# Patient Record
Sex: Female | Born: 1980 | Race: White | Hispanic: Yes | Marital: Married | Smoking: Former smoker
Health system: Southern US, Community
[De-identification: ages and names within clinical notes are randomized; demographics above are authoritative.]

## PROBLEM LIST (undated history)

## (undated) DIAGNOSIS — F32A Depression, unspecified: Secondary | ICD-10-CM

## (undated) DIAGNOSIS — R519 Headache, unspecified: Secondary | ICD-10-CM

## (undated) DIAGNOSIS — G4733 Obstructive sleep apnea (adult) (pediatric): Secondary | ICD-10-CM

## (undated) DIAGNOSIS — F3170 Bipolar disorder, currently in remission, most recent episode unspecified: Secondary | ICD-10-CM

## (undated) DIAGNOSIS — E538 Deficiency of other specified B group vitamins: Secondary | ICD-10-CM

## (undated) DIAGNOSIS — K603 Anal fistula, unspecified: Secondary | ICD-10-CM

## (undated) DIAGNOSIS — I10 Essential (primary) hypertension: Secondary | ICD-10-CM

## (undated) DIAGNOSIS — J309 Allergic rhinitis, unspecified: Secondary | ICD-10-CM

## (undated) DIAGNOSIS — F419 Anxiety disorder, unspecified: Secondary | ICD-10-CM

## (undated) DIAGNOSIS — E669 Obesity, unspecified: Secondary | ICD-10-CM

## (undated) DIAGNOSIS — R51 Headache: Secondary | ICD-10-CM

## (undated) DIAGNOSIS — L309 Dermatitis, unspecified: Secondary | ICD-10-CM

## (undated) DIAGNOSIS — F329 Major depressive disorder, single episode, unspecified: Secondary | ICD-10-CM

## (undated) DIAGNOSIS — Z8639 Personal history of other endocrine, nutritional and metabolic disease: Secondary | ICD-10-CM

## (undated) DIAGNOSIS — R112 Nausea with vomiting, unspecified: Secondary | ICD-10-CM

## (undated) DIAGNOSIS — D509 Iron deficiency anemia, unspecified: Secondary | ICD-10-CM

## (undated) DIAGNOSIS — K649 Unspecified hemorrhoids: Secondary | ICD-10-CM

## (undated) DIAGNOSIS — Z9889 Other specified postprocedural states: Secondary | ICD-10-CM

## (undated) HISTORY — PX: WISDOM TOOTH EXTRACTION: SHX21

## (undated) HISTORY — DX: Anxiety disorder, unspecified: F41.9

## (undated) HISTORY — PX: LAPAROSCOPIC CHOLECYSTECTOMY: SUR755

## (undated) HISTORY — DX: Major depressive disorder, single episode, unspecified: F32.9

## (undated) HISTORY — PX: WRIST GANGLION EXCISION: SUR520

## (undated) HISTORY — DX: Unspecified hemorrhoids: K64.9

## (undated) HISTORY — PX: FOOT SURGERY: SHX648

## (undated) HISTORY — DX: Depression, unspecified: F32.A

## (undated) HISTORY — DX: Essential (primary) hypertension: I10

## (undated) HISTORY — PX: GASTRIC BYPASS: SHX52

## (undated) HISTORY — PX: LEEP: SHX91

## (undated) HISTORY — DX: Headache, unspecified: R51.9

## (undated) HISTORY — DX: Headache: R51

## (undated) HISTORY — PX: LAPAROSCOPIC GASTRIC BANDING: SHX1100

## (undated) HISTORY — DX: Obesity, unspecified: E66.9

## (undated) HISTORY — PX: TONSILLECTOMY AND ADENOIDECTOMY: SUR1326

---

## 2016-09-02 NOTE — Progress Notes (Signed)
Pre visit review using our clinic review tool, if applicable. No additional management support is needed unless otherwise documented below in the visit note. 

## 2016-09-03 ENCOUNTER — Ambulatory Visit (INDEPENDENT_AMBULATORY_CARE_PROVIDER_SITE_OTHER): Payer: BLUE CROSS/BLUE SHIELD | Admitting: Family Medicine

## 2016-09-03 ENCOUNTER — Encounter: Payer: Self-pay | Admitting: Family Medicine

## 2016-09-03 VITALS — BP 104/70 | HR 77 | Temp 98.4°F | Resp 12 | Ht 64.5 in | Wt 232.2 lb

## 2016-09-03 DIAGNOSIS — F3175 Bipolar disorder, in partial remission, most recent episode depressed: Secondary | ICD-10-CM

## 2016-09-03 DIAGNOSIS — F418 Other specified anxiety disorders: Secondary | ICD-10-CM | POA: Diagnosis not present

## 2016-09-03 DIAGNOSIS — F3170 Bipolar disorder, currently in remission, most recent episode unspecified: Secondary | ICD-10-CM | POA: Insufficient documentation

## 2016-09-03 DIAGNOSIS — Z9884 Bariatric surgery status: Secondary | ICD-10-CM | POA: Diagnosis not present

## 2016-09-03 DIAGNOSIS — D509 Iron deficiency anemia, unspecified: Secondary | ICD-10-CM

## 2016-09-03 DIAGNOSIS — G4700 Insomnia, unspecified: Secondary | ICD-10-CM

## 2016-09-03 DIAGNOSIS — Z6839 Body mass index (BMI) 39.0-39.9, adult: Secondary | ICD-10-CM

## 2016-09-03 DIAGNOSIS — E039 Hypothyroidism, unspecified: Secondary | ICD-10-CM | POA: Diagnosis not present

## 2016-09-03 DIAGNOSIS — F329 Major depressive disorder, single episode, unspecified: Secondary | ICD-10-CM

## 2016-09-03 DIAGNOSIS — E538 Deficiency of other specified B group vitamins: Secondary | ICD-10-CM

## 2016-09-03 DIAGNOSIS — F419 Anxiety disorder, unspecified: Secondary | ICD-10-CM

## 2016-09-03 DIAGNOSIS — F32A Depression, unspecified: Secondary | ICD-10-CM | POA: Insufficient documentation

## 2016-09-03 LAB — CBC
HEMATOCRIT: 34.3 % — AB (ref 36.0–46.0)
HEMOGLOBIN: 11.3 g/dL — AB (ref 12.0–15.0)
MCHC: 33 g/dL (ref 30.0–36.0)
MCV: 84.7 fl (ref 78.0–100.0)
Platelets: 290 10*3/uL (ref 150.0–400.0)
RBC: 4.05 Mil/uL (ref 3.87–5.11)
RDW: 17.9 % — ABNORMAL HIGH (ref 11.5–15.5)
WBC: 6.6 10*3/uL (ref 4.0–10.5)

## 2016-09-03 LAB — VITAMIN B12: VITAMIN B 12: 116 pg/mL — AB (ref 211–911)

## 2016-09-03 LAB — FERRITIN: FERRITIN: 5.3 ng/mL — AB (ref 10.0–291.0)

## 2016-09-03 LAB — TSH: TSH: 1.63 u[IU]/mL (ref 0.35–4.50)

## 2016-09-03 LAB — VITAMIN D 25 HYDROXY (VIT D DEFICIENCY, FRACTURES): VITD: 30.24 ng/mL (ref 30.00–100.00)

## 2016-09-03 MED ORDER — ESCITALOPRAM OXALATE 20 MG PO TABS: 20.0000 mg | ORAL_TABLET | Freq: Every day | ORAL | 0 refills | Status: DC

## 2016-09-03 MED ORDER — QUETIAPINE FUMARATE 50 MG PO TABS: 150.0000 mg | ORAL_TABLET | Freq: Every day | ORAL | 1 refills | Status: DC

## 2016-09-03 NOTE — Progress Notes (Signed)
HPI:   [name] is a 35 y.o. female, who is here today to establish care with me.  Former PCP: N/A Last preventive routine visit: 03/2016 gyn preventive care.  Concerns today: Medications refill.  History of anxiety and depression.  Hx of psychiatric hospitalizations or ER visits: Denies Any hx of bipolar disorder: Years, last exacerbation about 3 years ago. Suicidal thoughts: Denies Insomnia: Yes, sleeping about 6 hours, wakes up a few times per night. Currently she is on inpatient CR 12.5 mg daily. She ran out of the Seroquel about 7-10 days ago.  She has not taking Lexapro for the past 6-7 months, since then her anxiety has been worse, states that she is "in a bad place" now, emotionally; which she attributes to not taking her meds and recent relocation in this area.  Tobacco use: Yes Alcohol abuse: Denies Hx of illicit drug use: Denies FHx for psychiatric disorders: Yes  Medications in the past: She has tried several medications, combination of Lexapro and Seroquel seemed to help the most.   She lives with husband and 3 children.   -She doesn't exercise regularly, she has not been consistent with a healthy diet. She reports long Hx of pain "all over"; knees and hips pain mainly, no edema or erythema, exacerbated by certain activities as going up or down stairs.  She denies any history of fibromyalgia.  She hasn't noted any rash or fever, no oral lesions, or hair loss.  + Fatigue and decreased appetite.  In the past she followed Weight Watchers, she doesn't feel like she is ready to follow a diet right now.      + Smoker.  -S/P bariatric surgery about 6-7 years ago, she is not taking multivitamins.  History of B12 deficiency, currently she is not on B12 supplementation, she used to receive B12 injections monthly.  History of iron deficiency anemia, she is not taking ferrous sulfate as instructed.    Denies abdominal pain, nausea, vomiting, changes in bowel  habits, blood in stool or melena.  She also reports history of hypothyroidism, in the past she was on thyroid medication but was discontinued because resolved.   Review of Systems  Constitutional: Positive for appetite change and fatigue. Negative for fever and unexpected weight change.  HENT: Negative for mouth sores, nosebleeds and trouble swallowing.   Eyes: Negative for pain and visual disturbance.  Respiratory: Negative for cough, shortness of breath and wheezing.   Cardiovascular: Negative for chest pain, palpitations and leg swelling.  Gastrointestinal: Negative for abdominal pain, nausea and vomiting.       Negative for changes in bowel habits.  Endocrine: Negative for cold intolerance, heat intolerance, polydipsia, polyphagia and polyuria.  Genitourinary: Negative for decreased urine volume, difficulty urinating and hematuria.  Musculoskeletal: Positive for arthralgias and myalgias.  Skin: Negative for rash and wound.  Neurological: Negative for seizures, syncope, weakness, numbness and headaches.  Psychiatric/Behavioral: Positive for sleep disturbance. Negative for agitation, confusion, dysphoric mood, hallucinations and suicidal ideas. The patient is nervous/anxious.       No current outpatient prescriptions on file prior to visit.   No current facility-administered medications on file prior to visit.      Past Medical History:  Diagnosis Date  . Anxiety   . Chicken pox   . Depression   . Frequent headaches   . Thyroid disease    Not on File  Family History  Problem Relation Age of Onset  . Mental illness Mother   .  Alcohol abuse Father   . Mental illness Father   . Mental illness Maternal Aunt   . Mental illness Maternal Uncle   . Alcohol abuse Maternal Uncle   . Mental illness Maternal Grandmother     Social History   Social History  . Marital status: Married    Spouse name: N/A  . Number of children: N/A  . Years of education: N/A   Social  History Main Topics  . Smoking status: Current Every Day Smoker  . Smokeless tobacco: Never Used  . Alcohol use Yes  . Drug use: No  . Sexual activity: Yes    Birth control/ protection: IUD   Other Topics Concern  . None   Social History Narrative  . None    Vitals:   09/03/16 1041  BP: 104/70  Pulse: 77  Resp: 12  Temp: 98.4 F (36.9 C)   O2 sat 96% at RA.  Body mass index is 39.25 kg/m.     Physical Exam  Nursing note and vitals reviewed. Constitutional: She is oriented to person, place, and time. She appears well-developed. No distress.  HENT:  Head: Atraumatic.  Mouth/Throat: Oropharynx is clear and moist and mucous membranes are normal.  Eyes: Conjunctivae and EOM are normal. Pupils are equal, round, and reactive to light.  Neck: No thyroid mass and no thyromegaly present.  Cardiovascular: Normal rate and regular rhythm.   No murmur heard. Pulses:      Dorsalis pedis pulses are 2+ on the right side, and 2+ on the left side.  Respiratory: Effort normal and breath sounds normal. No respiratory distress.  GI: Soft. She exhibits no mass. There is no hepatomegaly. There is no tenderness.  Musculoskeletal: She exhibits no edema or tenderness.  No significant joint deformities or limitation of ROM appreciated. No signs of synovitis.  Lymphadenopathy:    She has no cervical adenopathy.  Neurological: She is alert and oriented to person, place, and time. She has normal strength. Coordination and gait normal.  Skin: Skin is warm. No rash noted. No erythema.  Psychiatric: Her speech is normal. Her mood appears anxious. Her affect is labile. She is not agitated and not aggressive. She expresses no suicidal ideation. She expresses no suicidal plans.  adequately groomed, good eye contact.      ASSESSMENT AND PLAN:     Monica Jefferson was seen today for new patient (initial visit).  Diagnoses and all orders for this visit:  Insomnia, unspecified  Good sleep  hygiene. Changes on Ambien CR , I explained if I am continuing this medication I do not give 3 months supply at the same time. Some side effects discussed.  She will resume Seroquel 150 mg at bedtime.  Follow-up in 3-4 weeks.   -     QUEtiapine (SEROQUEL) 50 MG tablet; Take 3 tablets (150 mg total) by mouth at bedtime.  Hypothyroidism, unspecified hypothyroidism type  For the recommendations would be given according to lab results.  -     Ferritin -     TSH  Body mass index (BMI) of 39.0 to 39.9 in adult  We discussed benefits of wt loss as well as adverse effects of obesity. Consistency with healthy diet and physical activity recommended. Weight Watchers is a good option as well as daily brisk walking for 15-30 min as tolerated. For now she is not interested in following a specific diet.  Anxiety and depression  She denies any suicidal thought or plan. Resume Lexapro 20 mg daily. Clearly  instructed about warning signs. Follow-up in 3-4 weeks, before if needed.  -     escitalopram (LEXAPRO) 20 MG tablet; Take 1 tablet (20 mg total) by mouth daily.  Bipolar disorder, in partial remission, most recent episode depressed (HCC)  Resume Seroquel 150 mg daily. She prefers to hold on psychiatrist referral, she would like to do some research and she will let me know she would like to see.    Hx of bariatric surgery  We discussed some of the complications after bariatric procedures, recommend daily multivitamin. Further recommendations would be given according to lab results.  -     Vitamin B12 -     CBC -     Ferritin -     VITAMIN D 25 Hydroxy (Vit-D Deficiency, Fractures)  B12 deficiency  No changes in current management, will follow labs done today and will give further recommendations accordingly.  -     Vitamin B12  Anemia, iron deficiency  For the recommendations would be given according to lab results.  -     CBC -     Ferritin         Saima Monterroso G. [place],  MD  [facility]. [place] office.

## 2016-09-03 NOTE — Patient Instructions (Signed)
A few things to remember from today's visit:   Hypothyroidism, unspecified hypothyroidism type - Plan: Ferritin, TSH  Anxiety and depression - Plan: escitalopram (LEXAPRO) 20 MG tablet  Insomnia, unspecified - Plan: QUEtiapine (SEROQUEL) 50 MG tablet  Bipolar disorder, in partial remission, most recent episode depressed (HCC)  Hx of bariatric surgery - Plan: Vitamin B12, CBC, Ferritin, VITAMIN D 25 Hydroxy (Vit-D Deficiency, Fractures)  B12 deficiency - Plan: Vitamin B12  Anemia, iron deficiency - Plan: CBC, Ferritin  Medications resumed. Ambien I do not do 3 month supply at the time.  Please let me know which psychiatrist he wanted to see.  Try 15 min brisk walking daily.   We have ordered labs or studies at this visit.  It can take up to 1-2 weeks for results and processing. IF results require follow up or explanation, we will call you with instructions. Clinically stable results will be released to your [facility]. If you have not heard from us or cannot find your results in [facility] in 2 weeks please contact our office at [phone].  If you are not yet signed up for [facility], please consider signing up  Please be sure medication list is accurate. If a new problem present, please set up appointment sooner than planned today.

## 2016-09-07 ENCOUNTER — Encounter: Payer: Self-pay | Admitting: Family Medicine

## 2016-09-08 ENCOUNTER — Telehealth: Payer: Self-pay | Admitting: Family Medicine

## 2016-09-08 MED ORDER — CYANOCOBALAMIN 1000 MCG/ML IJ SOLN: 1000.0000 ug | Freq: Once | INTRAMUSCULAR | 11 refills | Status: AC

## 2016-09-08 MED ORDER — "SYRINGE/NEEDLE (DISP) 25G X 1"" 3 ML MISC": 1 refills | Status: DC

## 2016-09-08 MED ORDER — FERROUS SULFATE 325 (65 FE) MG PO TABS: 325.0000 mg | ORAL_TABLET | Freq: Every day | ORAL | 1 refills | Status: DC

## 2016-09-08 NOTE — Telephone Encounter (Signed)
[facility] °

## 2016-09-08 NOTE — Telephone Encounter (Signed)
Pt would like to do her own b12 injections. Would like b12 rx  and needles sent to   Also needs  Fe Sulfate 325 mg    [facility]/ [place]

## 2016-10-02 ENCOUNTER — Encounter: Payer: Self-pay | Admitting: Family Medicine

## 2016-10-02 NOTE — Telephone Encounter (Signed)
Okay to refill? 

## 2016-10-25 ENCOUNTER — Other Ambulatory Visit: Payer: Self-pay | Admitting: Family Medicine

## 2016-10-25 DIAGNOSIS — G4700 Insomnia, unspecified: Secondary | ICD-10-CM

## 2016-11-17 ENCOUNTER — Telehealth: Payer: Self-pay | Admitting: Family Medicine

## 2016-11-17 NOTE — Telephone Encounter (Signed)
Pt need new Rx for zolpidem    Pharm: [facility] [place]  Pt was wanting to know if you had a preference of sending it only to a local pharmacy or would you do a mail order pharmacy.

## 2016-11-17 NOTE — Telephone Encounter (Signed)
See below

## 2016-11-19 NOTE — Telephone Encounter (Signed)
Pt following up on request refill  zolpidem (AMBIEN CR) 12.5 MG CR tablet

## 2016-11-20 ENCOUNTER — Ambulatory Visit (INDEPENDENT_AMBULATORY_CARE_PROVIDER_SITE_OTHER): Payer: BLUE CROSS/BLUE SHIELD | Admitting: Family Medicine

## 2016-11-20 ENCOUNTER — Encounter: Payer: Self-pay | Admitting: Family Medicine

## 2016-11-20 VITALS — BP 126/90 | HR 89 | Resp 12 | Ht 64.5 in | Wt 233.2 lb

## 2016-11-20 DIAGNOSIS — F3175 Bipolar disorder, in partial remission, most recent episode depressed: Secondary | ICD-10-CM | POA: Diagnosis not present

## 2016-11-20 DIAGNOSIS — G4700 Insomnia, unspecified: Secondary | ICD-10-CM | POA: Diagnosis not present

## 2016-11-20 MED ORDER — QUETIAPINE FUMARATE ER 300 MG PO TB24: 300.0000 mg | ORAL_TABLET | Freq: Every day | ORAL | 1 refills | Status: DC

## 2016-11-20 MED ORDER — CYANOCOBALAMIN 1000 MCG/ML IJ SOLN: 1000.0000 ug | Freq: Once | INTRAMUSCULAR | 3 refills | Status: AC

## 2016-11-20 MED ORDER — ZOLPIDEM TARTRATE ER 12.5 MG PO TBCR: 12.5000 mg | EXTENDED_RELEASE_TABLET | Freq: Every day | ORAL | 3 refills | Status: DC

## 2016-11-20 NOTE — Patient Instructions (Signed)
A few things to remember from today's visit:   Insomnia, unspecified type - Plan: zolpidem (AMBIEN CR) 12.5 MG CR tablet  Bipolar disorder, in partial remission, most recent episode depressed (HCC) - Plan: QUEtiapine (SEROQUEL XR) 300 MG 24 hr tablet  Let me know how you do with Seroquel changes.   Please be sure medication list is accurate. If a new problem present, please set up appointment sooner than planned today.

## 2016-11-20 NOTE — Telephone Encounter (Signed)
I believe I saw her last 08/2016 and she was supposed to follow in 4-6 weeks after Lexapro was added. Thanks, BJ

## 2016-11-20 NOTE — Progress Notes (Signed)
Pre visit review using our clinic review tool, if applicable. No additional management support is needed unless otherwise documented below in the visit note. 

## 2016-11-20 NOTE — Progress Notes (Signed)
HPI:   [name] is a 35 y.o. female, who is here today (with her 2 children) to follow on on depression/bipolar disorder and insomnia. She was seen on 09/03/16, when Lexapro and Seroquel were resumed and Ambien CR 12.5 mg continued. She was supposed to follow  3-4 weeks later but she did not do so.  She discontinued Lexapro because decreased libido, which she forgot she also had when she tried before. She is on Seroquel 50 mg 3 tabs at bedtime, which helps her sleep along with Ambien.  She denies suicidal thoughts. Mild depression, which she feels like she could manage without adding other medications. She denies  anxiety.   Since her last OV, she has been exercising some, walking almost daily, no major changes in her diet.   Concerns today: Needs B12 sl send to mail order.Hx of B12 deficiency, s/p bariatric surgery.     Review of Systems  Constitutional: Positive for fatigue. Negative for appetite change, fever and unexpected weight change.  HENT: Negative for mouth sores, nosebleeds and trouble swallowing.   Respiratory: Negative for shortness of breath and wheezing.   Cardiovascular: Negative for chest pain and palpitations.  Gastrointestinal: Negative for abdominal pain, nausea and vomiting.       Negative for changes in bowel habits.  Neurological: Negative for syncope, weakness and headaches.  Psychiatric/Behavioral: Positive for sleep disturbance. Negative for confusion, hallucinations and suicidal ideas. The patient is not nervous/anxious.       Current Outpatient Prescriptions on File Prior to Visit  Medication Sig Dispense Refill  . Cyanocobalamin (B-12 PO) Take 1 tablet by mouth daily.    . ferrous sulfate 325 (65 FE) MG tablet Take 1 tablet (325 mg total) by mouth daily with breakfast. 90 tablet 1  . SYRINGE-NEEDLE, DISP, 3 ML 25G X 1" 3 ML MISC Use with B12 injections. 100 each 1   No current facility-administered medications on file prior to  visit.      Past Medical History:  Diagnosis Date  . Anxiety   . Chicken pox   . Depression   . Frequent headaches   . Thyroid disease    Not on File  Social History   Social History  . Marital status: Married    Spouse name: N/A  . Number of children: N/A  . Years of education: N/A   Social History Main Topics  . Smoking status: Current Every Day Smoker  . Smokeless tobacco: Never Used  . Alcohol use Yes  . Drug use: No  . Sexual activity: Yes    Birth control/ protection: IUD   Other Topics Concern  . None   Social History Narrative  . None    Vitals:   11/20/16 1529  BP: 126/90  Pulse: 89  Resp: 12   Body mass index is 39.42 kg/m.  Wt Readings from Last 3 Encounters:  11/20/16 233 lb 4 oz (105.8 kg)  09/03/16 232 lb 4 oz (105.3 kg)       Physical Exam  Nursing note and vitals reviewed. Constitutional: She is oriented to person, place, and time. She appears well-developed. No distress.  HENT:  Mouth/Throat: Oropharynx is clear and moist and mucous membranes are normal.  Eyes: Conjunctivae and EOM are normal.  Cardiovascular: Normal rate and regular rhythm.   No murmur heard. Respiratory: Effort normal and breath sounds normal. No respiratory distress.  GI: Soft.  Musculoskeletal: She exhibits no edema.  Neurological: She is alert and oriented  to person, place, and time. She has normal strength. Coordination and gait normal.  Skin: Skin is warm. No erythema.  Psychiatric: She has a normal mood and affect. Her speech is normal. Cognition and memory are normal.  Well groomed, good eye contact.      ASSESSMENT AND PLAN:     Monica Jefferson was seen today for medication management.  Diagnoses and all orders for this visit:  Insomnia, unspecified type  Well controlled with current management. Good sleep hygiene. No changes in Ambien. F/U in 3-4 months.   -     zolpidem (AMBIEN CR) 12.5 MG CR tablet; Take 1 tablet (12.5 mg total) by mouth at  bedtime.  Bipolar disorder, in partial remission, most recent episode depressed (HCC)  She agrees with increasing dose of Seroquel and changing from immediate release to XR. Instructed about warning signs. She was instructed to let me know through My Chart if she has any problem. Healthy diet and daily Probiotic may help.  F/U in 3-4 months.   -     QUEtiapine (SEROQUEL XR) 300 MG 24 hr tablet; Take 1 tablet (300 mg total) by mouth at bedtime.  Other orders -     cyanocobalamin (,VITAMIN B-12,) 1000 MCG/ML injection; Inject 1 mL (1,000 mcg total) into the muscle once.           -Ms. Monica Jefferson was advised to return sooner than planned today if new concerns arise.       Delphia Kaylor G. [place], MD  [facility]. [place] office.

## 2016-11-20 NOTE — Telephone Encounter (Signed)
Patient has an appointment for this afternoon.

## 2016-12-07 ENCOUNTER — Telehealth: Payer: Self-pay | Admitting: Family Medicine

## 2016-12-07 DIAGNOSIS — G4700 Insomnia, unspecified: Secondary | ICD-10-CM

## 2016-12-09 NOTE — Telephone Encounter (Addendum)
Pt would like to switch back to the  QUEtiapine (SEROQUEL) 50 MG tablet 3 tabs  Pt states the  QUEtiapine (SEROQUEL XR) 300 MG 24 hr tablet  is not working  [place],  [facility].  Pt leaving [place] tonight and would like to have asap. Will try and get it transferred to where she is going if we can just get [facility].

## 2016-12-10 MED ORDER — QUETIAPINE FUMARATE 50 MG PO TABS: ORAL_TABLET | ORAL | 1 refills | Status: DC

## 2016-12-10 NOTE — Addendum Note (Signed)
Addended by: Marcell AngerSELF, Akshar Starnes E on: 12/10/2016 09:49 AM   Modules accepted: Orders

## 2016-12-10 NOTE — Telephone Encounter (Signed)
Rx sent to pharmacy   

## 2016-12-10 NOTE — Telephone Encounter (Signed)
It is Ok to sent or call in Rx for Seroquel 50 mg to continue 3 tabs at bedtime. She can try also adding 2 tabs in the morning for a total of 250 mg daily and see if she notices any improvement.   She could change to 100 mg tabs, so she can take 1 tab am and 1.5 tab at bedtime instead taking 3 tabs [if she wants to continue 50 mg tab it is fine].  Thanks, BJ

## 2016-12-18 ENCOUNTER — Ambulatory Visit: Payer: BLUE CROSS/BLUE SHIELD | Admitting: Family Medicine

## 2016-12-19 ENCOUNTER — Ambulatory Visit: Payer: BLUE CROSS/BLUE SHIELD | Admitting: Family Medicine

## 2017-02-04 ENCOUNTER — Other Ambulatory Visit: Payer: Self-pay | Admitting: Family Medicine

## 2017-03-10 ENCOUNTER — Other Ambulatory Visit: Payer: Self-pay | Admitting: Family Medicine

## 2017-03-10 DIAGNOSIS — G4700 Insomnia, unspecified: Secondary | ICD-10-CM

## 2017-03-12 ENCOUNTER — Telehealth: Payer: Self-pay

## 2017-03-12 NOTE — Telephone Encounter (Signed)
Message sent to patient via mychart

## 2017-03-12 NOTE — Telephone Encounter (Signed)
-----   Message from Betty G [place], MD sent at 03/11/2017 10:38 PM EDT ----- Regarding: Refill I sent refill for Effexor 75 mg to her pharmacy # [id number]. Please advise to establish with psychiatrists to continue following on bipolar disorder and insomnia.  Thanks, BJ

## 2017-03-12 NOTE — Telephone Encounter (Signed)
Pt following up on request refill  zolpidem (AMBIEN CR) 12.5 MG CR tablet  Pt got a message somthing else was refilled and she does not need that medicine.  Pt is out of town and [facility]   Address: [address], [place], [place] [id number]        Phone: [phone]

## 2017-03-12 NOTE — Telephone Encounter (Signed)
Noted that Rx's for Ambien have been filled earlier: 11/20/16,12/17/16, 01/14/17, and 02/11/17. Technically she is due April 5-6th/2018. Ambien CR 12.5 mg can be called in to continue 1 tab at bedtime as needed #30/1 to be filled 03/17/2017 and 04/16/17.  Thanks, BJ

## 2017-03-12 NOTE — Telephone Encounter (Signed)
[facility] [address] Had to change back to the pharmacy that originally requested the refill to sign the order.  Patient cannot fill until 03/17/17. Previous Rx's have been filled earlier than they were suppose too, so she should still have some left.

## 2017-03-12 NOTE — Telephone Encounter (Signed)
Refill request sent to Dr. [place].

## 2017-04-09 ENCOUNTER — Telehealth: Payer: Self-pay | Admitting: Family Medicine

## 2017-04-09 ENCOUNTER — Other Ambulatory Visit: Payer: Self-pay | Admitting: Family Medicine

## 2017-04-09 MED ORDER — QUETIAPINE FUMARATE 50 MG PO TABS: 150.0000 mg | ORAL_TABLET | Freq: Every day | ORAL | 0 refills | Status: DC

## 2017-04-09 NOTE — Telephone Encounter (Signed)
Please have her schedule a follow up appointment and then I will send the Rx in.  Thank you!

## 2017-04-09 NOTE — Telephone Encounter (Signed)
° ° ° °  She has a fup scheduled for 7/18//18

## 2017-04-09 NOTE — Telephone Encounter (Signed)
Rx sent 

## 2017-04-09 NOTE — Telephone Encounter (Signed)
Pt has made the needed follow up before the 90 days will be up. Pt states she is out of the meds and needs as soon as you can send in. (please note [facility] pharmacy/  [facility] [address])

## 2017-04-09 NOTE — Telephone Encounter (Signed)
° ° °  Pt request refill of the following:  QUEtiapine (SEROQUEL) 50 MG tablet  Pt insurance plan will only pay for 90 day supply and she is asking that the RX be for 90 day supply    Phamacy: [facility] [address]

## 2017-05-04 ENCOUNTER — Telehealth: Payer: Self-pay | Admitting: Family Medicine

## 2017-05-04 NOTE — Telephone Encounter (Signed)
Patient calling to request to change pcp from Dr. [place]il[place]Jordan to Dr. Beverely Lowabori.

## 2017-05-04 NOTE — Telephone Encounter (Signed)
It is fine with me. ?Thanks ?

## 2017-05-04 NOTE — Telephone Encounter (Signed)
Ok w/ me 

## 2017-05-06 NOTE — Telephone Encounter (Signed)
Patient notified, appt scheduled.

## 2017-05-08 ENCOUNTER — Ambulatory Visit (INDEPENDENT_AMBULATORY_CARE_PROVIDER_SITE_OTHER): Payer: BLUE CROSS/BLUE SHIELD | Admitting: Family Medicine

## 2017-05-08 ENCOUNTER — Encounter: Payer: Self-pay | Admitting: Family Medicine

## 2017-05-08 VITALS — BP 123/81 | HR 75 | Temp 98.1°F | Resp 16 | Ht 65.0 in | Wt 238.0 lb

## 2017-05-08 DIAGNOSIS — E538 Deficiency of other specified B group vitamins: Secondary | ICD-10-CM

## 2017-05-08 DIAGNOSIS — E039 Hypothyroidism, unspecified: Secondary | ICD-10-CM | POA: Diagnosis not present

## 2017-05-08 DIAGNOSIS — G4700 Insomnia, unspecified: Secondary | ICD-10-CM | POA: Diagnosis not present

## 2017-05-08 DIAGNOSIS — J301 Allergic rhinitis due to pollen: Secondary | ICD-10-CM | POA: Diagnosis not present

## 2017-05-08 DIAGNOSIS — J302 Other seasonal allergic rhinitis: Secondary | ICD-10-CM | POA: Insufficient documentation

## 2017-05-08 DIAGNOSIS — Z975 Presence of (intrauterine) contraceptive device: Secondary | ICD-10-CM | POA: Diagnosis not present

## 2017-05-08 DIAGNOSIS — F3175 Bipolar disorder, in partial remission, most recent episode depressed: Secondary | ICD-10-CM | POA: Diagnosis not present

## 2017-05-08 DIAGNOSIS — Z6839 Body mass index (BMI) 39.0-39.9, adult: Secondary | ICD-10-CM

## 2017-05-08 LAB — CBC WITH DIFFERENTIAL/PLATELET
BASOS ABS: 0 {cells}/uL (ref 0–200)
Basophils Relative: 0 %
EOS ABS: 164 {cells}/uL (ref 15–500)
Eosinophils Relative: 2 %
HCT: 39.2 % (ref 35.0–45.0)
Hemoglobin: 12.7 g/dL (ref 11.7–15.5)
Lymphocytes Relative: 26 %
Lymphs Abs: 2132 cells/uL (ref 850–3900)
MCH: 28.3 pg (ref 27.0–33.0)
MCHC: 32.4 g/dL (ref 32.0–36.0)
MCV: 87.3 fL (ref 80.0–100.0)
MONOS PCT: 9 %
MPV: 10.8 fL (ref 7.5–12.5)
Monocytes Absolute: 738 cells/uL (ref 200–950)
Neutro Abs: 5166 cells/uL (ref 1500–7800)
Neutrophils Relative %: 63 %
Platelets: 309 10*3/uL (ref 140–400)
RBC: 4.49 MIL/uL (ref 3.80–5.10)
RDW: 15.4 % — ABNORMAL HIGH (ref 11.0–15.0)
WBC: 8.2 10*3/uL (ref 3.8–10.8)

## 2017-05-08 LAB — TSH: TSH: 2.65 mIU/L

## 2017-05-08 MED ORDER — ZOLPIDEM TARTRATE ER 12.5 MG PO TBCR: 12.5000 mg | EXTENDED_RELEASE_TABLET | Freq: Every day | ORAL | 0 refills | Status: DC

## 2017-05-08 MED ORDER — VORTIOXETINE HBR 10 MG PO TABS: 1.0000 | ORAL_TABLET | Freq: Every day | ORAL | 3 refills | Status: DC

## 2017-05-08 NOTE — Patient Instructions (Signed)
Follow up in 1 month to recheck mood We'll notify you of your lab results and make any changes if needed Start the Trintellix once daily Continue the Seroquel nightly Continue daily Zyrtec or Xyzal for the seasonal allergies Continue to work on healthy diet and regular exercise- you can do it! We'll call you with your GYN and allergy appts Call and schedule an appt with Psychiatry and Counseling Call with any questions or concerns Welcome!  We're glad to have you!!! We'll get this!!!

## 2017-05-08 NOTE — Progress Notes (Signed)
   Subjective:    Patient ID: Monica Jefferson, female    DOB: 12/14/1981, 36 y.o.   MRN: [id number]  HPI New to establish.  Previous MD- [place]  IUD- pt needs GYN referral b/c previous IUD required surgical removal.  Bipolar- chronic problem.  Currently on Seroquel.  Was previously on Lexapro but this decreased libido.  'i'm depressed'.  Asking for referral to psychiatrist and counseling.  B12 deficiency- ongoing issue.  Stopped taking B12 injxns.  + fatigue.  Hypothyroid- pt has hx of this.  Previously on medication.  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  Obesity- chronic problem.  Pt has gained 5 lbs since Dec.  BMI is now 39.61.  Not following a particular diet and not getting regular exercise  Seasonal allergies- chronic problem, was seeing allergist in [place].  Taking daily antihistamine w/o improvement.  Was previously getting allergy shots.  Pt hates nasal sprays.   Review of Systems For ROS see HPI     Objective:   Physical Exam  Constitutional: She is oriented to person, place, and time. She appears well-developed and well-nourished. No distress.  obese  HENT:  Head: Normocephalic and atraumatic.  Eyes: Conjunctivae and EOM are normal. Pupils are equal, round, and reactive to light.  Neck: Normal range of motion. Neck supple. No thyromegaly present.  Cardiovascular: Normal rate, regular rhythm, normal heart sounds and intact distal pulses.   No murmur heard. Pulmonary/Chest: Effort normal and breath sounds normal. No respiratory distress.  Abdominal: Soft. She exhibits no distension. There is no tenderness.  Musculoskeletal: She exhibits no edema.  Lymphadenopathy:    She has no cervical adenopathy.  Neurological: She is alert and oriented to person, place, and time.  Skin: Skin is warm and dry.  Psychiatric: Her behavior is normal.  Tearful, anxious  Vitals reviewed.         Assessment & Plan:

## 2017-05-08 NOTE — Progress Notes (Signed)
Pre visit review using our clinic review tool, if applicable. No additional management support is needed unless otherwise documented below in the visit note. 

## 2017-05-09 LAB — BASIC METABOLIC PANEL
BUN: 8 mg/dL (ref 7–25)
CO2: 23 mmol/L (ref 20–31)
Calcium: 9.8 mg/dL (ref 8.6–10.2)
Chloride: 101 mmol/L (ref 98–110)
Creat: 0.76 mg/dL (ref 0.50–1.10)
GLUCOSE: 86 mg/dL (ref 65–99)
POTASSIUM: 4.8 mmol/L (ref 3.5–5.3)
Sodium: 137 mmol/L (ref 135–146)

## 2017-05-09 LAB — LIPID PANEL
Cholesterol: 219 mg/dL — ABNORMAL HIGH (ref <200)
HDL: 55 mg/dL (ref >50)
LDL Cholesterol: 143 mg/dL — ABNORMAL HIGH (ref <100)
Total CHOL/HDL Ratio: 4 Ratio (ref <5.0)
Triglycerides: 107 mg/dL (ref <150)
VLDL: 21 mg/dL (ref <30)

## 2017-05-09 LAB — HEPATIC FUNCTION PANEL
ALK PHOS: 117 U/L — AB (ref 33–115)
ALT: 33 U/L — AB (ref 6–29)
AST: 37 U/L — ABNORMAL HIGH (ref 10–30)
Albumin: 4.5 g/dL (ref 3.6–5.1)
Bilirubin, Direct: 0.1 mg/dL (ref <=0.2)
Indirect Bilirubin: 0.2 mg/dL (ref 0.2–1.2)
TOTAL PROTEIN: 7.6 g/dL (ref 6.1–8.1)
Total Bilirubin: 0.3 mg/dL (ref 0.2–1.2)

## 2017-05-09 LAB — VITAMIN B12: Vitamin B-12: 300 pg/mL (ref 200–1100)

## 2017-05-12 NOTE — Assessment & Plan Note (Signed)
Pt has hx of this but not currently on medication.  Check labs.  Start meds prn.

## 2017-05-12 NOTE — Assessment & Plan Note (Signed)
Ongoing issue for pt.  She admits to being very depressed.  On Seroquel to stabilize mood but this is not helping w/ depression.  Previously on Lexapro but this decreased libido.  Will start Trintellix while awaiting appt w/ Psych.  Pt given list of names and numbers to schedule appt.  Will follow closely.

## 2017-05-12 NOTE — Assessment & Plan Note (Signed)
Ongoing issue for pt.  She has had 3 bariatric surgeries w/o success.  She is determined to lose weight w/ diet and exercise.  Check labs to risk stratify.  Will follow.

## 2017-05-12 NOTE — Assessment & Plan Note (Signed)
Pt needs GYN referral as strings were not visible at last pap.  Referral placed.

## 2017-05-12 NOTE — Assessment & Plan Note (Signed)
New to provider, ongoing for pt.  Was seeing allergist in [place] for shots.  Taking daily antihistamine w/o relief.  Pt doesn't tolerate nasal steroid sprays.  Discussed possible addition of Singulair but do not want to start more than 1 medication at this time and her emotional healthy is most important.  Will refer to Allergy for ongoing management.  Pt expressed understanding and is in agreement w/ plan.

## 2017-05-12 NOTE — Assessment & Plan Note (Signed)
Ongoing issue for pt.  Refill provided on Ambien as this helps considerably

## 2017-05-12 NOTE — Assessment & Plan Note (Signed)
Pt has hx of this.  Was previously getting B12 injxns but stopped.  Check levels and replete prn.

## 2017-05-13 ENCOUNTER — Telehealth: Payer: Self-pay | Admitting: *Deleted

## 2017-05-13 NOTE — Telephone Encounter (Signed)
Patient called regarding her lab results.  She does not want to take an OTC B12 because she says that it is easier to just do a shot a week.  She is asking if this is a possibility so that she does not waste money buy supplements since they aren't going to work.

## 2017-05-14 NOTE — Telephone Encounter (Signed)
Patient was informed of doing monthly B12 injections and not weekly.  Stated verbal understanding - She states that she has all the stuff to do her B12 injections monthly at home.  She says that she wants to continue this (she has 6 months worth), then she will call us for reevaluation.

## 2017-05-14 NOTE — Telephone Encounter (Signed)
Since she is at low end of normal but actually deficient at this time, would recommend B12 shots monthly x6 rather than weekly.  She can schedule those here at a nurse visit

## 2017-06-10 ENCOUNTER — Telehealth: Payer: Self-pay | Admitting: Family Medicine

## 2017-06-10 ENCOUNTER — Encounter: Payer: Self-pay | Admitting: Family Medicine

## 2017-06-10 ENCOUNTER — Ambulatory Visit (INDEPENDENT_AMBULATORY_CARE_PROVIDER_SITE_OTHER): Payer: BLUE CROSS/BLUE SHIELD | Admitting: Family Medicine

## 2017-06-10 VITALS — BP 128/89 | HR 89 | Temp 98.2°F | Resp 16 | Ht 65.0 in | Wt 237.0 lb

## 2017-06-10 DIAGNOSIS — F32A Depression, unspecified: Secondary | ICD-10-CM

## 2017-06-10 DIAGNOSIS — F329 Major depressive disorder, single episode, unspecified: Secondary | ICD-10-CM

## 2017-06-10 DIAGNOSIS — F419 Anxiety disorder, unspecified: Secondary | ICD-10-CM | POA: Diagnosis not present

## 2017-06-10 MED ORDER — BUSPIRONE HCL 15 MG PO TABS: 15.0000 mg | ORAL_TABLET | Freq: Two times a day (BID) | ORAL | 3 refills | Status: DC

## 2017-06-10 NOTE — Telephone Encounter (Signed)
Medication filled to pharmacy as requested.   

## 2017-06-10 NOTE — Progress Notes (Signed)
Pre visit review using our clinic review tool, if applicable. No additional management support is needed unless otherwise documented below in the visit note. 

## 2017-06-10 NOTE — Telephone Encounter (Signed)
Patients states she was seen this morning and [facility]. She is [facility] and [facility] [place] [facility].

## 2017-06-10 NOTE — Patient Instructions (Signed)
Follow up in 4-6 weeks, sooner if needed Start the Buspar twice daily.  Take a 1/2 tab twice daily x2 weeks and then increase to 1 tab twice daily Please call and schedule with a psychiatrist- you deserve to feel better!!! Continue to work on regular activity- this will improve your energy level! Call with any questions or concerns Have a great 4th!!!

## 2017-06-10 NOTE — Progress Notes (Signed)
   Subjective:    Patient ID: Monica Jefferson, female    DOB: 01/22/1981, 36 y.o.   MRN: [id number]  HPI Anxiety/depression- pt started Trintellix at last visit due to increased anxiety and depression.  She reports spells that she will have dizziness, HA, some sweats, tingling in hands/feet.  Pt reports she has these sxs both on and off medication.  Did not note any improvement in anxiety after a month of medication.  She then stopped meds.  Pt has not called any local psychs as discussed at last visit.   Review of Systems For ROS see HPI     Objective:   Physical Exam  Constitutional: She is oriented to person, place, and time. She appears well-developed and well-nourished. No distress.  HENT:  Head: Normocephalic and atraumatic.  Neurological: She is alert and oriented to person, place, and time.  Psychiatric: She has a normal mood and affect. Her behavior is normal. Thought content normal.  Vitals reviewed.         Assessment & Plan:

## 2017-06-10 NOTE — Assessment & Plan Note (Signed)
Pt did not notice any improvement on the Trintellix and stopped it after 3 weeks.  Feels her anxiety is the predominate factor right now and she will get depressed over the fact she has anxiety.  Since she has stopped various SSRIs due to side effects, will start Buspar.  Strongly encouraged her to call and set up appt w/ psych.  Will follow closely.

## 2017-07-01 ENCOUNTER — Ambulatory Visit: Payer: BLUE CROSS/BLUE SHIELD | Admitting: Family Medicine

## 2017-07-07 ENCOUNTER — Other Ambulatory Visit: Payer: Self-pay | Admitting: Family Medicine

## 2017-07-09 ENCOUNTER — Telehealth: Payer: Self-pay | Admitting: Family Medicine

## 2017-07-09 MED ORDER — QUETIAPINE FUMARATE 50 MG PO TABS: 150.0000 mg | ORAL_TABLET | Freq: Every day | ORAL | 0 refills | Status: DC

## 2017-07-09 NOTE — Telephone Encounter (Signed)
Pt states that Rx [facility] [place] and needs it to [facility] [address].

## 2017-07-09 NOTE — Telephone Encounter (Addendum)
Sorry for the confusion. [facility].

## 2017-07-09 NOTE — Telephone Encounter (Signed)
Medication filled to pharmacy as requested.   

## 2017-07-09 NOTE — Telephone Encounter (Signed)
Pt needs refill on QUEtiapine, [facility] [place]

## 2017-07-09 NOTE — Addendum Note (Signed)
Addended by: Geannie RisenBRODMERKEL, JESSICA L on: 07/09/2017 02:14 PM   Modules accepted: Orders

## 2017-07-15 ENCOUNTER — Ambulatory Visit: Payer: BLUE CROSS/BLUE SHIELD | Admitting: Family Medicine

## 2017-07-27 ENCOUNTER — Other Ambulatory Visit: Payer: Self-pay | Admitting: General Surgery

## 2017-07-27 NOTE — H&P (Signed)
History of Present Illness Monica Jefferson([place] MD; 07/27/2017 10:16 AM) The patient is a 36 year old female who presents with a complaint of anal problems. Patient reports at least a one-year history of perirectal lesion. She describes a nodular lesion that drains pus and occasionally blood. It becomes irritated and painful at times. She also describes some itchiness in the area. She has never had any anal rectal procedures in the past. She denies any history of perirectal abscess. She reports regular bowel habits.   Past Surgical History (Tanisha A. Manson PasseyBrown, RMA; 07/27/2017 10:11 AM) Foot Surgery  Bilateral. Gallbladder Surgery - Laparoscopic  Gastric Bypass  Lap Band  Oral Surgery  Tonsillectomy   Diagnostic Studies History (Tanisha A. Manson PasseyBrown, RMA; 07/27/2017 10:11 AM) Colonoscopy  never Mammogram  never Pap Smear  1-5 years ago  Allergies (Tanisha A. Manson PasseyBrown, RMA; 07/27/2017 10:12 AM) No Known Drug Allergies 07/27/2017 Allergies Reconciled   Medication History (Tanisha A. Manson PasseyBrown, RMA; 07/27/2017 10:12 AM) Zolpidem Tartrate ER (12.5MG  Tablet ER, Oral) Active. Montelukast Sodium (10MG  Tablet, Oral) Active. QUEtiapine Fumarate (50MG  Tablet, Oral) Active. Medications Reconciled  Social History (Tanisha A. Manson PasseyBrown, RMA; 07/27/2017 10:11 AM) Alcohol use  Occasional alcohol use. Caffeine use  Carbonated beverages. Illicit drug use  Remotely quit drug use. Tobacco use  Current every day smoker.  Family History (Tanisha A. Manson PasseyBrown, RMA; 07/27/2017 10:11 AM) Depression  Family Members In General, Mother, Sister. Diabetes Mellitus  Family Members In General. Heart Disease  Family Members In General. Migraine Headache  Mother.  Pregnancy / Birth History (Tanisha A. Manson PasseyBrown, RMA; 07/27/2017 10:11 AM) Age at menarche  8 years. Contraceptive History  Intrauterine device. Gravida  2 Length (months) of breastfeeding  12-24 Maternal age  38-25 Para  3 Regular periods    Other Problems (Tanisha A. Manson PasseyBrown, RMA; 07/27/2017 10:11 AM) Anxiety Disorder  Cholelithiasis  Depression  Hemorrhoids  Migraine Headache     Review of Systems ([name] RMA; 07/27/2017 10:11 AM) General Not Present- Appetite Loss, Chills, Fatigue, Fever, Night Sweats, Weight Gain and Weight Loss. Skin Present- Dryness. Not Present- Change in Wart/Mole, Hives, Jaundice, New Lesions, Non-Healing Wounds, Rash and Ulcer. HEENT Present- Seasonal Allergies. Not Present- Earache, Hearing Loss, Hoarseness, Nose Bleed, Oral Ulcers, Ringing in the Ears, Sinus Pain, Sore Throat, Visual Disturbances, Wears glasses/contact lenses and Yellow Eyes. Respiratory Not Present- Bloody sputum, Chronic Cough, Difficulty Breathing, Snoring and Wheezing. Breast Not Present- Breast Mass, Breast Pain, Nipple Discharge and Skin Changes. Cardiovascular Not Present- Chest Pain, Difficulty Breathing Lying Down, Leg Cramps, Palpitations, Rapid Heart Rate, Shortness of Breath and Swelling of Extremities. Gastrointestinal Present- Hemorrhoids and Rectal Pain. Not Present- Abdominal Pain, Bloating, Bloody Stool, Change in Bowel Habits, Chronic diarrhea, Constipation, Difficulty Swallowing, Excessive gas, Gets full quickly at meals, Indigestion, Nausea and Vomiting. Female Genitourinary Not Present- Frequency, Nocturia, Painful Urination, Pelvic Pain and Urgency. Musculoskeletal Not Present- Back Pain, Joint Pain, Joint Stiffness, Muscle Pain, Muscle Weakness and Swelling of Extremities. Neurological Present- Headaches. Not Present- Decreased Memory, Fainting, Numbness, Seizures, Tingling, Tremor, Trouble walking and Weakness. Psychiatric Present- Anxiety, Bipolar, Change in Sleep Pattern and Depression. Not Present- Fearful and Frequent crying. Endocrine Not Present- Cold Intolerance, Excessive Hunger, Hair Changes, Heat Intolerance, Hot flashes and New Diabetes. Hematology Not Present- Blood Thinners, Easy  Bruising, Excessive bleeding, Gland problems, HIV and Persistent Infections.  Vitals ([name] RMA; 07/27/2017 10:12 AM) 07/27/2017 10:11 AM Weight: 236.2 lb Height: 64in Body Surface Area: 2.1 m Body Mass  Index: 40.54 kg/m  Temp.: 96.52F  Pulse: 105 (Regular)  P.OX: 99% (Room air) BP: 126/84 (Sitting, Left Arm, Standard)       Physical Exam Monica Jefferson(Odessa Morren MD; 07/27/2017 10:24 AM) General Mental Status-Alert. General Appearance-Not in acute distress. Build & Nutrition-Well nourished. Posture-Normal posture. Gait-Normal.  Head and Neck Head-normocephalic, atraumatic with no lesions or palpable masses. Trachea-midline.  Chest and Lung Exam Chest and lung exam reveals -on auscultation, normal breath sounds, no adventitious sounds and normal vocal resonance.  Cardiovascular Cardiovascular examination reveals -normal heart sounds, regular rate and rhythm with no murmurs and no digital clubbing, cyanosis, edema, increased warmth or tenderness.  Abdomen Inspection Inspection of the abdomen reveals - No Hernias. Palpation/Percussion Palpation and Percussion of the abdomen reveal - Soft, Non Tender, No Rigidity (guarding), No hepatosplenomegaly and No Palpable abdominal masses.  Rectal Anorectal Exam External - Note: L posterior external opening just distal to dentate line.  Neurologic Neurologic evaluation reveals -alert and oriented x 3 with no impairment of recent or remote memory, normal attention span and ability to concentrate, normal sensation and normal coordination.  Musculoskeletal Normal Exam - Bilateral-Upper Extremity Strength Normal and Lower Extremity Strength Normal.    Assessment & Plan Monica Jefferson(Casandra Dallaire MD; 07/27/2017 10:27 AM) ANAL FISTULA (K60.3) Impression: 36 year old female who presents to the office with a perirectal nodule draining purulence. On exam she appears to have an anal fistula. I have recommended exam  under anesthesia with possible fistulotomy. I do not think it is very likely that we will need to place a seton. Risks include bleeding and

## 2017-08-03 ENCOUNTER — Other Ambulatory Visit: Payer: Self-pay | Admitting: Family Medicine

## 2017-08-03 DIAGNOSIS — G4700 Insomnia, unspecified: Secondary | ICD-10-CM

## 2017-08-03 NOTE — Telephone Encounter (Signed)
Last OV 06/10/17 Zolpidem last filled 05/08/17 #90 with 0

## 2017-08-03 NOTE — Telephone Encounter (Signed)
Medication filled to pharmacy as requested.   

## 2017-08-05 ENCOUNTER — Encounter (HOSPITAL_BASED_OUTPATIENT_CLINIC_OR_DEPARTMENT_OTHER): Payer: Self-pay | Admitting: *Deleted

## 2017-08-06 ENCOUNTER — Encounter (HOSPITAL_BASED_OUTPATIENT_CLINIC_OR_DEPARTMENT_OTHER): Payer: Self-pay | Admitting: *Deleted

## 2017-08-06 NOTE — Progress Notes (Signed)
NPO AFTER MN.  ARRIVE AT [id number].  NEEDS HG AND URINE PREG.

## 2017-08-14 ENCOUNTER — Ambulatory Visit (HOSPITAL_BASED_OUTPATIENT_CLINIC_OR_DEPARTMENT_OTHER): Payer: BLUE CROSS/BLUE SHIELD | Admitting: Anesthesiology

## 2017-08-14 ENCOUNTER — Ambulatory Visit (HOSPITAL_BASED_OUTPATIENT_CLINIC_OR_DEPARTMENT_OTHER)
Admission: RE | Admit: 2017-08-14 | Discharge: 2017-08-14 | Disposition: A | Discharge status: Home / Self Care | Payer: BLUE CROSS/BLUE SHIELD | Source: Ambulatory Visit | Attending: General Surgery | Admitting: General Surgery

## 2017-08-14 ENCOUNTER — Encounter (HOSPITAL_BASED_OUTPATIENT_CLINIC_OR_DEPARTMENT_OTHER)
Admission: RE | Disposition: A | Discharge status: Home / Self Care | Payer: Self-pay | Source: Ambulatory Visit | Attending: General Surgery

## 2017-08-14 ENCOUNTER — Encounter (HOSPITAL_BASED_OUTPATIENT_CLINIC_OR_DEPARTMENT_OTHER): Payer: Self-pay | Admitting: Anesthesiology

## 2017-08-14 DIAGNOSIS — Z833 Family history of diabetes mellitus: Secondary | ICD-10-CM | POA: Diagnosis not present

## 2017-08-14 DIAGNOSIS — Z8249 Family history of ischemic heart disease and other diseases of the circulatory system: Secondary | ICD-10-CM | POA: Diagnosis not present

## 2017-08-14 DIAGNOSIS — F419 Anxiety disorder, unspecified: Secondary | ICD-10-CM | POA: Diagnosis not present

## 2017-08-14 DIAGNOSIS — Z818 Family history of other mental and behavioral disorders: Secondary | ICD-10-CM | POA: Insufficient documentation

## 2017-08-14 DIAGNOSIS — K603 Anal fistula: Secondary | ICD-10-CM | POA: Insufficient documentation

## 2017-08-14 DIAGNOSIS — Z6839 Body mass index (BMI) 39.0-39.9, adult: Secondary | ICD-10-CM | POA: Insufficient documentation

## 2017-08-14 DIAGNOSIS — Z9049 Acquired absence of other specified parts of digestive tract: Secondary | ICD-10-CM | POA: Insufficient documentation

## 2017-08-14 DIAGNOSIS — F17200 Nicotine dependence, unspecified, uncomplicated: Secondary | ICD-10-CM | POA: Diagnosis not present

## 2017-08-14 DIAGNOSIS — Z820 Family history of epilepsy and other diseases of the nervous system: Secondary | ICD-10-CM | POA: Insufficient documentation

## 2017-08-14 DIAGNOSIS — Z951 Presence of aortocoronary bypass graft: Secondary | ICD-10-CM | POA: Insufficient documentation

## 2017-08-14 DIAGNOSIS — Z79899 Other long term (current) drug therapy: Secondary | ICD-10-CM | POA: Insufficient documentation

## 2017-08-14 HISTORY — DX: Iron deficiency anemia, unspecified: D50.9

## 2017-08-14 HISTORY — DX: Anal fistula: K60.3

## 2017-08-14 HISTORY — DX: Other specified postprocedural states: Z98.890

## 2017-08-14 HISTORY — PX: PLACEMENT OF SETON: SHX6029

## 2017-08-14 HISTORY — DX: Anal fistula, unspecified: K60.30

## 2017-08-14 HISTORY — DX: Deficiency of other specified B group vitamins: E53.8

## 2017-08-14 HISTORY — DX: Bipolar disorder, currently in remission, most recent episode unspecified: F31.70

## 2017-08-14 HISTORY — DX: Obstructive sleep apnea (adult) (pediatric): G47.33

## 2017-08-14 HISTORY — DX: Personal history of other endocrine, nutritional and metabolic disease: Z86.39

## 2017-08-14 HISTORY — DX: Nausea with vomiting, unspecified: R11.2

## 2017-08-14 HISTORY — PX: EVALUATION UNDER ANESTHESIA WITH ANAL FISTULECTOMY: SHX5621

## 2017-08-14 HISTORY — DX: Dermatitis, unspecified: L30.9

## 2017-08-14 HISTORY — DX: Allergic rhinitis, unspecified: J30.9

## 2017-08-14 LAB — POCT PREGNANCY, URINE: Preg Test, Ur: NEGATIVE

## 2017-08-14 SURGERY — EXAM UNDER ANESTHESIA WITH ANAL FISTULECTOMY
Anesthesia: Monitor Anesthesia Care | Site: Rectum

## 2017-08-14 MED ORDER — MIDAZOLAM HCL 2 MG/2ML IJ SOLN
INTRAMUSCULAR | Status: AC
  Filled 2017-08-14: qty 4

## 2017-08-14 MED ORDER — CELECOXIB 400 MG PO CAPS
400.0000 mg | ORAL_CAPSULE | ORAL | Status: AC
  Administered 2017-08-14: 400 mg via ORAL
  Filled 2017-08-14: qty 1

## 2017-08-14 MED ORDER — LACTATED RINGERS IV SOLN
INTRAVENOUS | Status: DC
  Administered 2017-08-14 (×2): via INTRAVENOUS
  Filled 2017-08-14: qty 1000

## 2017-08-14 MED ORDER — FENTANYL CITRATE (PF) 250 MCG/5ML IJ SOLN
INTRAMUSCULAR | Status: DC | PRN
  Administered 2017-08-14: 50 ug via INTRAVENOUS

## 2017-08-14 MED ORDER — BUPIVACAINE-EPINEPHRINE 0.5% -1:200000 IJ SOLN
INTRAMUSCULAR | Status: DC | PRN
  Administered 2017-08-14: 30 mL

## 2017-08-14 MED ORDER — PROPOFOL 10 MG/ML IV BOLUS
INTRAVENOUS | Status: DC | PRN
  Administered 2017-08-14: 30 mg via INTRAVENOUS

## 2017-08-14 MED ORDER — OXYCODONE HCL 5 MG PO TABS
5.0000 mg | ORAL_TABLET | ORAL | Status: DC | PRN
  Filled 2017-08-14: qty 2

## 2017-08-14 MED ORDER — FENTANYL CITRATE (PF) 100 MCG/2ML IJ SOLN
25.0000 ug | INTRAMUSCULAR | Status: DC | PRN
  Filled 2017-08-14: qty 1

## 2017-08-14 MED ORDER — ACETAMINOPHEN 325 MG PO TABS
650.0000 mg | ORAL_TABLET | ORAL | Status: DC | PRN
  Filled 2017-08-14: qty 2

## 2017-08-14 MED ORDER — PROPOFOL 500 MG/50ML IV EMUL
INTRAVENOUS | Status: AC
  Filled 2017-08-14: qty 50

## 2017-08-14 MED ORDER — FAMOTIDINE 20 MG PO TABS
ORAL_TABLET | ORAL | Status: AC
  Filled 2017-08-14: qty 1

## 2017-08-14 MED ORDER — FENTANYL CITRATE (PF) 100 MCG/2ML IJ SOLN
INTRAMUSCULAR | Status: AC
  Filled 2017-08-14: qty 2

## 2017-08-14 MED ORDER — ACETAMINOPHEN 500 MG PO TABS
ORAL_TABLET | ORAL | Status: AC
  Filled 2017-08-14: qty 2

## 2017-08-14 MED ORDER — SODIUM CHLORIDE 0.9% FLUSH
3.0000 mL | INTRAVENOUS | Status: DC | PRN
  Filled 2017-08-14: qty 3

## 2017-08-14 MED ORDER — ACETAMINOPHEN 650 MG RE SUPP
650.0000 mg | RECTAL | Status: DC | PRN
  Filled 2017-08-14: qty 1

## 2017-08-14 MED ORDER — GABAPENTIN 300 MG PO CAPS
300.0000 mg | ORAL_CAPSULE | ORAL | Status: AC
  Administered 2017-08-14: 300 mg via ORAL
  Filled 2017-08-14: qty 1

## 2017-08-14 MED ORDER — MIDAZOLAM HCL 5 MG/5ML IJ SOLN
INTRAMUSCULAR | Status: DC | PRN
  Administered 2017-08-14: 2 mg via INTRAVENOUS

## 2017-08-14 MED ORDER — FAMOTIDINE 20 MG PO TABS
20.0000 mg | ORAL_TABLET | Freq: Once | ORAL | Status: AC
  Administered 2017-08-14: 20 mg via ORAL
  Filled 2017-08-14: qty 1

## 2017-08-14 MED ORDER — SCOPOLAMINE 1 MG/3DAYS TD PT72
1.0000 | MEDICATED_PATCH | TRANSDERMAL | Status: DC
  Administered 2017-08-14: 1.5 mg via TRANSDERMAL
  Filled 2017-08-14: qty 1

## 2017-08-14 MED ORDER — LIDOCAINE 5 % EX OINT
TOPICAL_OINTMENT | CUTANEOUS | Status: DC | PRN
  Administered 2017-08-14: 1

## 2017-08-14 MED ORDER — PROPOFOL 500 MG/50ML IV EMUL
INTRAVENOUS | Status: DC | PRN
  Administered 2017-08-14: 50 ug/kg/min via INTRAVENOUS

## 2017-08-14 MED ORDER — ACETAMINOPHEN 500 MG PO TABS
1000.0000 mg | ORAL_TABLET | ORAL | Status: AC
  Administered 2017-08-14: 1000 mg via ORAL
  Filled 2017-08-14: qty 2

## 2017-08-14 MED ORDER — ONDANSETRON HCL 4 MG/2ML IJ SOLN
4.0000 mg | Freq: Once | INTRAMUSCULAR | Status: DC | PRN
  Filled 2017-08-14: qty 2

## 2017-08-14 MED ORDER — GABAPENTIN 300 MG PO CAPS
ORAL_CAPSULE | ORAL | Status: AC
  Filled 2017-08-14: qty 1

## 2017-08-14 MED ORDER — HYDROCODONE-ACETAMINOPHEN 5-325 MG PO TABS: 1.0000 | ORAL_TABLET | ORAL | 0 refills | Status: DC | PRN

## 2017-08-14 MED ORDER — LIDOCAINE 2% (20 MG/ML) 5 ML SYRINGE
INTRAMUSCULAR | Status: DC | PRN
  Administered 2017-08-14: 60 mg via INTRAVENOUS

## 2017-08-14 MED ORDER — SODIUM CHLORIDE 0.9 % IV SOLN
250.0000 mL | INTRAVENOUS | Status: DC | PRN
  Filled 2017-08-14: qty 250

## 2017-08-14 MED ORDER — SODIUM CHLORIDE 0.9% FLUSH
3.0000 mL | Freq: Two times a day (BID) | INTRAVENOUS | Status: DC
  Filled 2017-08-14: qty 3

## 2017-08-14 MED ORDER — SCOPOLAMINE 1 MG/3DAYS TD PT72
MEDICATED_PATCH | TRANSDERMAL | Status: AC
  Filled 2017-08-14: qty 1

## 2017-08-14 MED ORDER — LIDOCAINE 2% (20 MG/ML) 5 ML SYRINGE
INTRAMUSCULAR | Status: AC
  Filled 2017-08-14: qty 5

## 2017-08-14 MED ORDER — CELECOXIB 200 MG PO CAPS
ORAL_CAPSULE | ORAL | Status: AC
  Filled 2017-08-14: qty 2

## 2017-08-14 SURGICAL SUPPLY — 54 items
BENZOIN TINCTURE PRP APPL 2/3 (GAUZE/BANDAGES/DRESSINGS) ×4 IMPLANT
BLADE HEX COATED 2.75 (ELECTRODE) ×2 IMPLANT
BLADE SURG 10 STRL SS (BLADE) IMPLANT
BLADE SURG 15 STRL LF DISP TIS (BLADE) IMPLANT
BLADE SURG 15 STRL SS (BLADE)
BRIEF STRETCH FOR OB PAD LRG (UNDERPADS AND DIAPERS) ×2 IMPLANT
CANISTER SUCT 3000ML PPV (MISCELLANEOUS) ×2 IMPLANT
COVER BACK TABLE 60X90IN (DRAPES) ×2 IMPLANT
COVER MAYO STAND STRL (DRAPES) ×2 IMPLANT
DECANTER SPIKE VIAL GLASS SM (MISCELLANEOUS) ×2 IMPLANT
DRAPE LAPAROTOMY 100X72 PEDS (DRAPES) ×2 IMPLANT
DRAPE UTILITY XL STRL (DRAPES) ×2 IMPLANT
ELECT BLADE 6.5 .24CM SHAFT (ELECTRODE) IMPLANT
ELECT REM PT RETURN 9FT ADLT (ELECTROSURGICAL) ×2
ELECTRODE REM PT RTRN 9FT ADLT (ELECTROSURGICAL) ×1 IMPLANT
GAUZE SPONGE 4X4 12PLY STRL LF (GAUZE/BANDAGES/DRESSINGS) ×2 IMPLANT
GAUZE SPONGE 4X4 16PLY XRAY LF (GAUZE/BANDAGES/DRESSINGS) IMPLANT
GLOVE BIO SURGEON STRL SZ 6.5 (GLOVE) ×2 IMPLANT
GLOVE BIOGEL PI IND STRL 7.0 (GLOVE) ×1 IMPLANT
GLOVE BIOGEL PI INDICATOR 7.0 (GLOVE) ×1
GLOVE INDICATOR 7.0 STRL GRN (GLOVE) ×2 IMPLANT
GOWN SPEC L3 XXLG W/TWL (GOWN DISPOSABLE) ×2 IMPLANT
GOWN STRL REUS W/TWL 2XL LVL3 (GOWN DISPOSABLE) ×2 IMPLANT
HYDROGEN PEROXIDE 16OZ (MISCELLANEOUS) ×2 IMPLANT
KIT RM TURNOVER CYSTO AR (KITS) ×2 IMPLANT
LOOP VESSEL MAXI BLUE (MISCELLANEOUS) ×2 IMPLANT
NDL SAFETY ECLIPSE 18X1.5 (NEEDLE) IMPLANT
NEEDLE HYPO 18GX1.5 SHARP (NEEDLE)
NEEDLE HYPO 22GX1.5 SAFETY (NEEDLE) ×2 IMPLANT
NS IRRIG 500ML POUR BTL (IV SOLUTION) ×2 IMPLANT
PACK BASIN DAY SURGERY FS (CUSTOM PROCEDURE TRAY) ×2 IMPLANT
PAD ABD 8X10 STRL (GAUZE/BANDAGES/DRESSINGS) ×2 IMPLANT
PAD ARMBOARD 7.5X6 YLW CONV (MISCELLANEOUS) ×2 IMPLANT
PENCIL BUTTON HOLSTER BLD 10FT (ELECTRODE) ×2 IMPLANT
SPONGE GAUZE 4X4 12PLY STER LF (GAUZE/BANDAGES/DRESSINGS) ×2 IMPLANT
SPONGE HEMORRHOID 8X3CM (HEMOSTASIS) IMPLANT
SPONGE SURGIFOAM ABS GEL 12-7 (HEMOSTASIS) IMPLANT
SUCTION FRAZIER HANDLE 10FR (MISCELLANEOUS)
SUCTION TUBE FRAZIER 10FR DISP (MISCELLANEOUS) IMPLANT
SUT CHROMIC 2 0 SH (SUTURE) IMPLANT
SUT CHROMIC 3 0 SH 27 (SUTURE) IMPLANT
SUT ETHIBOND 0 (SUTURE) ×2 IMPLANT
SUT SILK 2 0 (SUTURE)
SUT SILK 2-0 18XBRD TIE 12 (SUTURE) IMPLANT
SUT VIC AB 2-0 SH 27 (SUTURE)
SUT VIC AB 2-0 SH 27XBRD (SUTURE) IMPLANT
SUT VIC AB 3-0 SH 18 (SUTURE) IMPLANT
SUT VIC AB 4-0 P-3 18XBRD (SUTURE) IMPLANT
SUT VIC AB 4-0 P3 18 (SUTURE)
SYR CONTROL 10ML LL (SYRINGE) ×2 IMPLANT
TOWEL OR 17X24 6PK STRL BLUE (TOWEL DISPOSABLE) ×2 IMPLANT
TRAY DSU PREP LF (CUSTOM PROCEDURE TRAY) ×2 IMPLANT
TUBE CONNECTING 12X1/4 (SUCTIONS) ×2 IMPLANT
YANKAUER SUCT BULB TIP NO VENT (SUCTIONS) ×2 IMPLANT

## 2017-08-14 NOTE — Anesthesia Procedure Notes (Signed)
Procedure Name: MAC Date/Time: 08/14/2017 8:23 AM Performed by: Bethena Roys T Pre-anesthesia Checklist: Patient identified, Timeout performed, Emergency Drugs available, Suction available and Patient being monitored Patient Re-evaluated:Patient Re-evaluated prior to induction Oxygen Delivery Method: Nasal cannula Placement Confirmation: positive ETCO2

## 2017-08-14 NOTE — Anesthesia Postprocedure Evaluation (Signed)
Anesthesia Post Note  Patient: Monica Jefferson  Procedure(s) Performed: Procedure(s) (LRB): ANAL EXAM UNDER ANESTHESIA (N/A) PLACEMENT OF SETON (N/A)     Patient location during evaluation: PACU Anesthesia Type: MAC Level of consciousness: awake and alert Pain management: pain level controlled Vital Signs Assessment: post-procedure vital signs reviewed and stable Respiratory status: spontaneous breathing, nonlabored ventilation, respiratory function stable and patient connected to nasal cannula oxygen Cardiovascular status: stable and blood pressure returned to baseline Anesthetic complications: no    Last Vitals:  Vitals:   08/14/17 0900 08/14/17 0915  BP: 110/61 111/76  Pulse: 74 82  Resp: 10 (!) 24  Temp:    SpO2: 98% 96%    Last Pain:  Vitals:   08/14/17 0704  TempSrc: Oral                 Ryan P Ellender

## 2017-08-14 NOTE — H&P (View-Only) (Signed)
   History of Present Illness (Monica Jefferson; 07/27/2017 10:16 AM) The patient is a 36 year old female who presents with a complaint of anal problems. Patient reports at least a one-year history of perirectal lesion. She describes a nodular lesion that drains pus and occasionally blood. It becomes irritated and painful at times. She also describes some itchiness in the area. She has never had any anal rectal procedures in the past. She denies any history of perirectal abscess. She reports regular bowel habits.   Past Surgical History (Monica Jefferson, RMA; 07/27/2017 10:11 AM) Foot Surgery  Bilateral. Gallbladder Surgery - Laparoscopic  Gastric Bypass  Lap Band  Oral Surgery  Tonsillectomy   Diagnostic Studies History (Monica Jefferson, RMA; 07/27/2017 10:11 AM) Colonoscopy  never Mammogram  never Pap Smear  1-5 years ago  Allergies (Monica Jefferson, RMA; 07/27/2017 10:12 AM) No Known Drug Allergies 07/27/2017 Allergies Reconciled   Medication History (Monica Jefferson, RMA; 07/27/2017 10:12 AM) Zolpidem Tartrate ER (12.5MG Tablet ER, Oral) Active. Montelukast Sodium (10MG Tablet, Oral) Active. QUEtiapine Fumarate (50MG Tablet, Oral) Active. Medications Reconciled  Social History (Monica Jefferson, RMA; 07/27/2017 10:11 AM) Alcohol use  Occasional alcohol use. Caffeine use  Carbonated beverages. Illicit drug use  Remotely quit drug use. Tobacco use  Current every day smoker.  Family History (Monica Jefferson, RMA; 07/27/2017 10:11 AM) Depression  Family Members In General, Mother, Sister. Diabetes Mellitus  Family Members In General. Heart Disease  Family Members In General. Migraine Headache  Mother.  Pregnancy / Birth History (Monica Jefferson, RMA; 07/27/2017 10:11 AM) Age at menarche  8 years. Contraceptive History  Intrauterine device. Gravida  2 Length (months) of breastfeeding  12-24 Maternal age  21-25 Para  3 Regular periods    Other Problems (Monica Jefferson, RMA; 07/27/2017 10:11 AM) Anxiety Disorder  Cholelithiasis  Depression  Hemorrhoids  Migraine Headache     Review of Systems (Monica Jefferson RMA; 07/27/2017 10:11 AM) General Not Present- Appetite Loss, Chills, Fatigue, Fever, Night Sweats, Weight Gain and Weight Loss. Skin Present- Dryness. Not Present- Change in Wart/Mole, Hives, Jaundice, New Lesions, Non-Healing Wounds, Rash and Ulcer. HEENT Present- Seasonal Allergies. Not Present- Earache, Hearing Loss, Hoarseness, Nose Bleed, Oral Ulcers, Ringing in the Ears, Sinus Pain, Sore Throat, Visual Disturbances, Wears glasses/contact lenses and Yellow Eyes. Respiratory Not Present- Bloody sputum, Chronic Cough, Difficulty Breathing, Snoring and Wheezing. Breast Not Present- Breast Mass, Breast Pain, Nipple Discharge and Skin Changes. Cardiovascular Not Present- Chest Pain, Difficulty Breathing Lying Down, Leg Cramps, Palpitations, Rapid Heart Rate, Shortness of Breath and Swelling of Extremities. Gastrointestinal Present- Hemorrhoids and Rectal Pain. Not Present- Abdominal Pain, Bloating, Bloody Stool, Change in Bowel Habits, Chronic diarrhea, Constipation, Difficulty Swallowing, Excessive gas, Gets full quickly at meals, Indigestion, Nausea and Vomiting. Female Genitourinary Not Present- Frequency, Nocturia, Painful Urination, Pelvic Pain and Urgency. Musculoskeletal Not Present- Back Pain, Joint Pain, Joint Stiffness, Muscle Pain, Muscle Weakness and Swelling of Extremities. Neurological Present- Headaches. Not Present- Decreased Memory, Fainting, Numbness, Seizures, Tingling, Tremor, Trouble walking and Weakness. Psychiatric Present- Anxiety, Bipolar, Change in Sleep Pattern and Depression. Not Present- Fearful and Frequent crying. Endocrine Not Present- Cold Intolerance, Excessive Hunger, Hair Changes, Heat Intolerance, Hot flashes and New Diabetes. Hematology Not Present- Blood Thinners, Easy  Bruising, Excessive bleeding, Gland problems, HIV and Persistent Infections.  Vitals (Monica Jefferson RMA; 07/27/2017 10:12 AM) 07/27/2017 10:11 AM Weight: 236.2 lb Height: 64in Body Surface Area: 2.1 m Body Mass   Index: 40.54 kg/m  Temp.: 96.52F  Pulse: 105 (Regular)  P.OX: 99% (Room air) BP: 126/84 (Sitting, Left Arm, Standard)       Physical Exam Monica Jefferson(Rees Matura Jefferson; 07/27/2017 10:24 AM) General Mental Status-Alert. General Appearance-Not in acute distress. Build & Nutrition-Well nourished. Posture-Normal posture. Gait-Normal.  Head and Neck Head-normocephalic, atraumatic with no lesions or palpable masses. Trachea-midline.  Chest and Lung Exam Chest and lung exam reveals -on auscultation, normal breath sounds, no adventitious sounds and normal vocal resonance.  Cardiovascular Cardiovascular examination reveals -normal heart sounds, regular rate and rhythm with no murmurs and no digital clubbing, cyanosis, edema, increased warmth or tenderness.  Abdomen Inspection Inspection of the abdomen reveals - No Hernias. Palpation/Percussion Palpation and Percussion of the abdomen reveal - Soft, Non Tender, No Rigidity (guarding), No hepatosplenomegaly and No Palpable abdominal masses.  Rectal Anorectal Exam External - Note: L posterior external opening just distal to dentate line.  Neurologic Neurologic evaluation reveals -alert and oriented x 3 with no impairment of recent or remote memory, normal attention span and ability to concentrate, normal sensation and normal coordination.  Musculoskeletal Normal Exam - Bilateral-Upper Extremity Strength Normal and Lower Extremity Strength Normal.    Assessment & Plan Monica Jefferson(Owais Pruett Jefferson; 07/27/2017 10:27 AM) ANAL FISTULA (K60.3) Impression: 36 year old female who presents to the office with a perirectal nodule draining purulence. On exam she appears to have an anal fistula. I have recommended exam  under anesthesia with possible fistulotomy. I do not think it is very likely that we will need to place a seton. Risks include bleeding and

## 2017-08-14 NOTE — Discharge Instructions (Addendum)

## 2017-08-14 NOTE — Op Note (Addendum)
08/14/2017  8:37 AM  PATIENT:  Monica Jefferson  36 y.o. female  Patient Care Team: Midge Minium, MD as PCP - General (Family Medicine) Gustavo Lah, NP as Nurse Practitioner (Obstetrics and Gynecology)  PRE-OPERATIVE DIAGNOSIS:  anal fistula  POST-OPERATIVE DIAGNOSIS:  anal fistula  PROCEDURE:   ANAL EXAM UNDER ANESTHESIA PLACEMENT OF SETON  SURGEON:  Surgeon(s): Leighton Ruff, MD  ASSISTANT: none   ANESTHESIA:   local and MAC  SPECIMEN:  No Specimen  DISPOSITION OF SPECIMEN:  N/A  COUNTS:  YES  PLAN OF CARE: Discharge to home after PACU  PATIENT DISPOSITION:  PACU - hemodynamically stable.  INDICATION: 36 year old female with clinical signs of anal fistula.   OR FINDINGS: left posterior lateral transsphincteric anal fistula  DESCRIPTION: the patient was identified in the preoperative holding area and taken to the OR where they were laid on the operating room table.  MAC anesthesia was induced without difficulty. The patient was then positioned in prone jackknife position with buttocks gently taped apart.  The patient was then prepped and draped in usual sterile fashion.  SCDs were noted to be in place prior to the initiation of anesthesia. A surgical timeout was performed indicating the correct patient, procedure, positioning and need for preoperative antibiotics.  A rectal block was performed using Marcaine with epinephrine .    I began with a digital rectal exam.  There are no palpated masses in the anal canal  I then placed a Hill-Ferguson anoscope into the anal canal and evaluated this completely.  The anal canal appeared normal. There were no overt hemorrhoids. I could not visualize an internal opening. I placed a S-shaped fistula probe into the external opening.  This traversed underneath the external sphincter and exited at posterior midline at the dentate line. There appeared to be too much overlying muscle to perform a fistulotomy. A seton was placed. An  Ethibond suture was brought through the fistulotomy site and a vessel loop was pulled back through to create a seton. Ethibond suture was then used to fasten the vessel loop into a seton. The patient was then awakened from anesthesia and sent to the postanesthesia carried in stable condition. All counts were correct per operating room staff.  I have reviewed the [name] and see no other narcotics prescriptions listed for this patient.

## 2017-08-14 NOTE — Anesthesia Preprocedure Evaluation (Addendum)
Anesthesia Evaluation  Patient identified by MRN, date of birth, ID band Patient awake    Reviewed: Allergy & Precautions, NPO status , Patient's Chart, lab work & pertinent test results  History of Anesthesia Complications (+) PONV and history of anesthetic complications  Airway Mallampati: II  TM Distance: >3 FB Neck ROM: Full    Dental no notable dental hx.    Pulmonary Current Smoker,    Pulmonary exam normal breath sounds clear to auscultation       Cardiovascular negative cardio ROS Normal cardiovascular exam Rhythm:Regular Rate:Normal     Neuro/Psych PSYCHIATRIC DISORDERS Anxiety Depression Bipolar Disorder negative neurological ROS     GI/Hepatic negative GI ROS, Neg liver ROS,   Endo/Other  Morbid obesity  Renal/GU negative Renal ROS     Musculoskeletal negative musculoskeletal ROS (+)   Abdominal   Peds  Hematology negative hematology ROS (+)   Anesthesia Other Findings Unable to remove ring on left hand  Reproductive/Obstetrics                            Anesthesia Physical Anesthesia Plan  ASA: III  Anesthesia Plan: MAC   Post-op Pain Management:    Induction: Intravenous  PONV Risk Score and Plan: 2 and Propofol infusion, Treatment may vary due to age or medical condition and Ondansetron  Airway Management Planned: Natural Airway  Additional Equipment:   Intra-op Plan:   Post-operative Plan:   Informed Consent: I have reviewed the patients History and Physical, chart, labs and discussed the procedure including the risks, benefits and alternatives for the proposed anesthesia with the patient or authorized representative who has indicated his/her understanding and acceptance.   Dental advisory given  Plan Discussed with: CRNA  Anesthesia Plan Comments:        Anesthesia Quick Evaluation

## 2017-08-14 NOTE — Interval H&P Note (Signed)
History and Physical Interval Note:  08/14/2017 7:18 AM  Monica Jefferson  has presented today for surgery, with the diagnosis of anal fistula  The various methods of treatment have been discussed with the patient and family. After consideration of risks, benefits and other options for treatment, the patient has consented to  Procedure(s): ANAL EXAM UNDER ANESTHESIA (N/A) PLACEMENT OF SETON VS FISTULOTOMY (N/A) as a surgical intervention .  The patient's history has been reviewed, patient examined, no change in status, stable for surgery.  I have reviewed the patient's chart and labs.  Questions were answered to the patient's satisfaction.     Vanita PandaAlicia C Druscilla Petsch, MD  Colorectal and General Surgery [facility]

## 2017-08-14 NOTE — Transfer of Care (Signed)
Immediate Anesthesia Transfer of Care Note  Patient: Monica Jefferson  Procedure(s) Performed: Procedure(s): ANAL EXAM UNDER ANESTHESIA (N/A) PLACEMENT OF SETON (N/A)  Patient Location: PACU  Anesthesia Type:MAC  Level of Consciousness: awake, alert  and oriented  Airway & Oxygen Therapy: Patient Spontanous Breathing and Patient connected to nasal cannula oxygen  Post-op Assessment: Report given to RN  Post vital signs: Reviewed and stable  Last Vitals:  Vitals:   08/14/17 0704 08/14/17 0845  BP: 134/87 114/66  Pulse: 85 91  Resp: 18 (!) 9  Temp: 36.8 C 36.6 C  SpO2: 98% 100%    Last Pain:  Vitals:   08/14/17 0704  TempSrc: Oral      Patients Stated Pain Goal: 6 (28/41/32 4401)  Complications: No apparent anesthesia complications

## 2017-08-18 ENCOUNTER — Encounter (HOSPITAL_BASED_OUTPATIENT_CLINIC_OR_DEPARTMENT_OTHER): Payer: Self-pay | Admitting: General Surgery

## 2017-08-18 LAB — POCT HEMOGLOBIN-HEMACUE: Hemoglobin: 11.4 g/dL — ABNORMAL LOW (ref 12.0–15.0)

## 2017-09-25 ENCOUNTER — Ambulatory Visit: Payer: Self-pay | Admitting: General Surgery

## 2017-10-03 ENCOUNTER — Other Ambulatory Visit: Payer: Self-pay | Admitting: Family Medicine

## 2017-10-05 NOTE — Patient Instructions (Addendum)
Monica Jefferson  10/05/2017   Your procedure is scheduled on: 10-09-17   [facility]  [facility] [place] [facility] to 3rd floor to  Short Stay Center at 7:00 AM.   Call this number if you have problems the morning of surgery [phone]    Remember: ONLY 1 PERSON MAY GO WITH YOU TO SHORT STAY TO GET  READY MORNING OF YOUR SURGERY.  Do not eat food or drink liquids :After Midnight.     Take these medicines the morning of surgery with A SIP OF WATER: None                                 You may not have any metal on your body including hair pins and              piercings  Do not wear jewelry, make-up, lotions, powders or perfumes, deodorant             Do not wear nail polish.  Do not shave  48 hours prior to surgery.              Do not bring valuables to the hospital. [place] IS NOT             RESPONSIBLE   FOR VALUABLES.  Contacts, dentures or bridgework may not be worn into surgery.       Patients discharged the day of surgery will not be allowed to drive home.  Name and phone number of your driver: Loraine LericheMark [phone]                Please read over the following fact sheets you were given: _____________________________________________________________________             [facility] - Preparing [facility] Before surgery, you can play an important role.  Because skin is not sterile, your skin needs to be as free of germs as possible.  You can reduce the number of germs on your skin by washing with CHG (chlorahexidine gluconate) soap before surgery.  CHG is an antiseptic cleaner which kills germs and bonds with the skin to continue killing germs even after washing. Please DO NOT use if you have an allergy to CHG or antibacterial soaps.  If your skin becomes reddened/irritated stop using the CHG and inform your nurse when you arrive at Short Stay. Do not shave (including legs and underarms) for at least 48 hours prior to the first  CHG shower.  You may shave your face/neck. Please follow these instructions carefully:  1.  Shower with CHG Soap the night before surgery and the  morning of Surgery.  2.  If you choose to wash your hair, wash your hair first as usual with your  normal  shampoo.  3.  After you shampoo, rinse your hair and body thoroughly to remove the  shampoo.                           4.  Use CHG as you would any other liquid soap.  You can apply chg directly  to the skin and wash                       Gently with a scrungie or clean washcloth.  5.  Apply the CHG Soap to your body ONLY FROM THE NECK DOWN.   Do not use on face/ open                           Wound or open sores. Avoid contact with eyes, ears mouth and genitals (private parts).                       Wash face,  Genitals (private parts) with your normal soap.             6.  Wash thoroughly, paying special attention to the area where your surgery  will be performed.  7.  Thoroughly rinse your body with warm water from the neck down.  8.  DO NOT shower/wash with your normal soap after using and rinsing off  the CHG Soap.                9.  Pat yourself dry with a clean towel.            10.  Wear clean pajamas.            11.  Place clean sheets on your bed the night of your first shower and do not  sleep with pets. Day of Surgery : Do not apply any lotions/deodorants the morning of surgery.  Please wear clean clothes to the hospital/surgery center.  FAILURE TO FOLLOW THESE INSTRUCTIONS MAY RESULT IN THE CANCELLATION OF YOUR SURGERY PATIENT SIGNATURE_________________________________  NURSE SIGNATURE__________________________________  ________________________________________________________________________

## 2017-10-06 ENCOUNTER — Encounter (HOSPITAL_COMMUNITY): Payer: Self-pay

## 2017-10-06 ENCOUNTER — Encounter (HOSPITAL_COMMUNITY)
Admission: RE | Admit: 2017-10-06 | Discharge: 2017-10-06 | Disposition: A | Discharge status: Home / Self Care | Payer: BLUE CROSS/BLUE SHIELD | Source: Ambulatory Visit | Attending: General Surgery | Admitting: General Surgery

## 2017-10-06 DIAGNOSIS — F17210 Nicotine dependence, cigarettes, uncomplicated: Secondary | ICD-10-CM | POA: Diagnosis not present

## 2017-10-06 DIAGNOSIS — Z6839 Body mass index (BMI) 39.0-39.9, adult: Secondary | ICD-10-CM | POA: Diagnosis not present

## 2017-10-06 DIAGNOSIS — K605 Anorectal fistula: Secondary | ICD-10-CM | POA: Diagnosis not present

## 2017-10-06 LAB — HCG, SERUM, QUALITATIVE: PREG SERUM: NEGATIVE

## 2017-10-06 LAB — CBC
HCT: 37.9 % (ref 36.0–46.0)
HEMOGLOBIN: 12 g/dL (ref 12.0–15.0)
MCH: 28.3 pg (ref 26.0–34.0)
MCHC: 31.7 g/dL (ref 30.0–36.0)
MCV: 89.4 fL (ref 78.0–100.0)
Platelets: 276 10*3/uL (ref 150–400)
RBC: 4.24 MIL/uL (ref 3.87–5.11)
RDW: 15.6 % — AB (ref 11.5–15.5)
WBC: 6.7 10*3/uL (ref 4.0–10.5)

## 2017-10-08 NOTE — Anesthesia Preprocedure Evaluation (Signed)
Anesthesia Evaluation  Patient identified by MRN, date of birth, ID band Patient awake    Reviewed: Allergy & Precautions, NPO status , Patient's Chart, lab work & pertinent test results  History of Anesthesia Complications (+) PONV and history of anesthetic complications  Airway Mallampati: II  TM Distance: >3 FB Neck ROM: Full    Dental no notable dental hx.    Pulmonary neg pulmonary ROS, Current Smoker,    Pulmonary exam normal breath sounds clear to auscultation       Cardiovascular negative cardio ROS Normal cardiovascular exam Rhythm:Regular Rate:Normal     Neuro/Psych PSYCHIATRIC DISORDERS Anxiety Depression Bipolar Disorder negative neurological ROS  negative psych ROS   GI/Hepatic negative GI ROS, Neg liver ROS,   Endo/Other  negative endocrine ROSMorbid obesity  Renal/GU negative Renal ROS  negative genitourinary   Musculoskeletal negative musculoskeletal ROS (+)   Abdominal   Peds negative pediatric ROS (+)  Hematology negative hematology ROS (+)   Anesthesia Other Findings Unable to remove ring on left hand  Reproductive/Obstetrics negative OB ROS                             Anesthesia Physical  Anesthesia Plan  ASA: III  Anesthesia Plan: MAC   Post-op Pain Management:    Induction: Intravenous  PONV Risk Score and Plan: 2 and Propofol infusion, Treatment may vary due to age or medical condition, Ondansetron and Midazolam  Airway Management Planned: Natural Airway and Nasal Cannula  Additional Equipment:   Intra-op Plan:   Post-operative Plan:   Informed Consent: I have reviewed the patients History and Physical, chart, labs and discussed the procedure including the risks, benefits and alternatives for the proposed anesthesia with the patient or authorized representative who has indicated his/her understanding and acceptance.   Dental advisory given  Plan  Discussed with: CRNA  Anesthesia Plan Comments:         Anesthesia Quick Evaluation

## 2017-10-08 NOTE — Progress Notes (Signed)
CALLED AND SPOKE W/ PT VIA PHONE.   PT VERBALIZED UNDERSTANDING ABOUT CHANGE OF LOCATION FOR HER SURGERY TO [facility].  PT STATED KNOW WHERE WE ARE LOCATED AND WILL BE HERE AT 0700.  CURRENT LAB WORK  DONE AT PAT VISIT ,RESULTS IN CHART AND EPIC.

## 2017-10-09 ENCOUNTER — Ambulatory Visit (HOSPITAL_BASED_OUTPATIENT_CLINIC_OR_DEPARTMENT_OTHER): Payer: BLUE CROSS/BLUE SHIELD | Admitting: Anesthesiology

## 2017-10-09 ENCOUNTER — Encounter (HOSPITAL_BASED_OUTPATIENT_CLINIC_OR_DEPARTMENT_OTHER)
Admission: RE | Disposition: A | Discharge status: Home / Self Care | Payer: Self-pay | Source: Ambulatory Visit | Attending: General Surgery

## 2017-10-09 ENCOUNTER — Encounter (HOSPITAL_BASED_OUTPATIENT_CLINIC_OR_DEPARTMENT_OTHER): Payer: Self-pay

## 2017-10-09 ENCOUNTER — Ambulatory Visit (HOSPITAL_COMMUNITY)
Admission: RE | Admit: 2017-10-09 | Discharge: 2017-10-09 | Disposition: A | Discharge status: Home / Self Care | Payer: BLUE CROSS/BLUE SHIELD | Source: Ambulatory Visit | Attending: General Surgery | Admitting: General Surgery

## 2017-10-09 DIAGNOSIS — K605 Anorectal fistula: Secondary | ICD-10-CM | POA: Insufficient documentation

## 2017-10-09 DIAGNOSIS — F17210 Nicotine dependence, cigarettes, uncomplicated: Secondary | ICD-10-CM | POA: Insufficient documentation

## 2017-10-09 DIAGNOSIS — Z6839 Body mass index (BMI) 39.0-39.9, adult: Secondary | ICD-10-CM | POA: Insufficient documentation

## 2017-10-09 HISTORY — PX: PLACEMENT OF SETON: SHX6029

## 2017-10-09 SURGERY — PLACEMENT, SETON
Anesthesia: Monitor Anesthesia Care | Site: Anus

## 2017-10-09 MED ORDER — GLYCOPYRROLATE 0.2 MG/ML IJ SOLN
INTRAMUSCULAR | Status: DC | PRN
  Administered 2017-10-09: 0.1 mg via INTRAVENOUS

## 2017-10-09 MED ORDER — PROPOFOL 500 MG/50ML IV EMUL
INTRAVENOUS | Status: DC | PRN
  Administered 2017-10-09: 60 ug/kg/min via INTRAVENOUS

## 2017-10-09 MED ORDER — PROPOFOL 10 MG/ML IV BOLUS
INTRAVENOUS | Status: DC | PRN
  Administered 2017-10-09: 10 mg via INTRAVENOUS
  Administered 2017-10-09 (×2): 20 mg via INTRAVENOUS

## 2017-10-09 MED ORDER — FENTANYL CITRATE (PF) 100 MCG/2ML IJ SOLN
INTRAMUSCULAR | Status: AC
  Filled 2017-10-09: qty 2

## 2017-10-09 MED ORDER — MIDAZOLAM HCL 2 MG/2ML IJ SOLN
INTRAMUSCULAR | Status: AC
  Filled 2017-10-09: qty 2

## 2017-10-09 MED ORDER — LIDOCAINE 5 % EX OINT
TOPICAL_OINTMENT | CUTANEOUS | Status: DC | PRN
  Administered 2017-10-09: 1 via TOPICAL

## 2017-10-09 MED ORDER — DEXAMETHASONE SODIUM PHOSPHATE 10 MG/ML IJ SOLN
INTRAMUSCULAR | Status: AC
  Filled 2017-10-09: qty 1

## 2017-10-09 MED ORDER — HYDROCODONE-ACETAMINOPHEN 5-325 MG PO TABS: 1.0000 | ORAL_TABLET | Freq: Four times a day (QID) | ORAL | 0 refills | Status: DC | PRN

## 2017-10-09 MED ORDER — SCOPOLAMINE 1 MG/3DAYS TD PT72
1.0000 | MEDICATED_PATCH | TRANSDERMAL | Status: DC
  Administered 2017-10-09: 1.5 mg via TRANSDERMAL
  Filled 2017-10-09: qty 1

## 2017-10-09 MED ORDER — GLYCOPYRROLATE 0.2 MG/ML IV SOSY
PREFILLED_SYRINGE | INTRAVENOUS | Status: AC
  Filled 2017-10-09: qty 5

## 2017-10-09 MED ORDER — SCOPOLAMINE 1 MG/3DAYS TD PT72
MEDICATED_PATCH | TRANSDERMAL | Status: AC
  Filled 2017-10-09: qty 1

## 2017-10-09 MED ORDER — DEXAMETHASONE SODIUM PHOSPHATE 10 MG/ML IJ SOLN
INTRAMUSCULAR | Status: DC | PRN
  Administered 2017-10-09: 8 mg via INTRAVENOUS

## 2017-10-09 MED ORDER — LACTATED RINGERS IV SOLN
INTRAVENOUS | Status: DC
  Administered 2017-10-09: 1000 mL via INTRAVENOUS
  Filled 2017-10-09: qty 1000

## 2017-10-09 MED ORDER — ONDANSETRON HCL 4 MG/2ML IJ SOLN
INTRAMUSCULAR | Status: AC
  Filled 2017-10-09: qty 2

## 2017-10-09 MED ORDER — ONDANSETRON HCL 4 MG/2ML IJ SOLN
INTRAMUSCULAR | Status: DC | PRN
  Administered 2017-10-09: 4 mg via INTRAVENOUS

## 2017-10-09 MED ORDER — LIDOCAINE 2% (20 MG/ML) 5 ML SYRINGE
INTRAMUSCULAR | Status: AC
  Filled 2017-10-09: qty 5

## 2017-10-09 MED ORDER — BUPIVACAINE-EPINEPHRINE 0.5% -1:200000 IJ SOLN
INTRAMUSCULAR | Status: DC | PRN
  Administered 2017-10-09: 30 mL

## 2017-10-09 MED ORDER — MIDAZOLAM HCL 2 MG/2ML IJ SOLN
INTRAMUSCULAR | Status: DC | PRN
  Administered 2017-10-09: 2 mg via INTRAVENOUS

## 2017-10-09 MED ORDER — FENTANYL CITRATE (PF) 100 MCG/2ML IJ SOLN
INTRAMUSCULAR | Status: DC | PRN
  Administered 2017-10-09: 50 ug via INTRAVENOUS

## 2017-10-09 SURGICAL SUPPLY — 22 items
BENZOIN TINCTURE PRP APPL 2/3 (GAUZE/BANDAGES/DRESSINGS) ×2 IMPLANT
BRIEF STRETCH FOR OB PAD LRG (UNDERPADS AND DIAPERS) ×2 IMPLANT
COVER BACK TABLE 60X90IN (DRAPES) ×2 IMPLANT
COVER MAYO STAND STRL (DRAPES) ×2 IMPLANT
DRAPE LAPAROTOMY 100X72 PEDS (DRAPES) ×2 IMPLANT
DRAPE UTILITY XL STRL (DRAPES) ×2 IMPLANT
GAUZE SPONGE 4X4 12PLY STRL LF (GAUZE/BANDAGES/DRESSINGS) ×2 IMPLANT
GLOVE BIO SURGEON STRL SZ 6.5 (GLOVE) ×2 IMPLANT
GLOVE BIOGEL PI IND STRL 7.0 (GLOVE) ×1 IMPLANT
GLOVE BIOGEL PI INDICATOR 7.0 (GLOVE) ×1
GOWN STRL REUS W/TWL 2XL LVL3 (GOWN DISPOSABLE) ×2 IMPLANT
KIT RM TURNOVER CYSTO AR (KITS) ×2 IMPLANT
LOOP VESSEL MAXI BLUE (MISCELLANEOUS) ×2 IMPLANT
NEEDLE HYPO 22GX1.5 SAFETY (NEEDLE) ×2 IMPLANT
NS IRRIG 500ML POUR BTL (IV SOLUTION) ×2 IMPLANT
PACK BASIN DAY SURGERY FS (CUSTOM PROCEDURE TRAY) ×2 IMPLANT
PAD ABD 8X10 STRL (GAUZE/BANDAGES/DRESSINGS) ×2 IMPLANT
PAD ARMBOARD 7.5X6 YLW CONV (MISCELLANEOUS) ×2 IMPLANT
SUT ETHIBOND 0 (SUTURE) ×2 IMPLANT
SYR CONTROL 10ML LL (SYRINGE) ×2 IMPLANT
TOWEL OR 17X24 6PK STRL BLUE (TOWEL DISPOSABLE) ×2 IMPLANT
TRAY DSU PREP LF (CUSTOM PROCEDURE TRAY) ×2 IMPLANT

## 2017-10-09 NOTE — H&P (Addendum)
The patient is a 36 year old female who presents with a complaint of anal problems. Patient reports at least a one-year history of perirectal lesion. She describes a nodular lesion that drains pus and occasionally blood. It becomes irritated and painful at times. She also describes some itchiness in the area. She has never had any anal rectal procedures in the past. She denies any history of perirectal abscess. She reports regular bowel habits. She underwent an EUA in Sept and this showed a fistula.  Burnadette PopSeton was placed.  She called the office several weeks later and stated that it fell out.     Past Surgical History (Tanisha A. Manson PasseyBrown, RMA; 07/27/2017 10:11 AM) Foot Surgery  Bilateral. Gallbladder Surgery - Laparoscopic  Gastric Bypass  Lap Band  Oral Surgery  Tonsillectomy   Diagnostic Studies History (Tanisha A. Manson PasseyBrown, RMA; 07/27/2017 10:11 AM) Colonoscopy  never Mammogram  never Pap Smear  1-5 years ago  Allergies (Tanisha A. Manson PasseyBrown, RMA; 07/27/2017 10:12 AM) No Known Drug Allergies 07/27/2017 Allergies Reconciled   Medication History (Tanisha A. Manson PasseyBrown, RMA; 07/27/2017 10:12 AM) Zolpidem Tartrate ER (12.5MG  Tablet ER, Oral) Active. Montelukast Sodium (10MG  Tablet, Oral) Active. QUEtiapine Fumarate (50MG  Tablet, Oral) Active. Medications Reconciled  Social History (Tanisha A. Manson PasseyBrown, RMA; 07/27/2017 10:11 AM) Alcohol use  Occasional alcohol use. Caffeine use  Carbonated beverages. Illicit drug use  Remotely quit drug use. Tobacco use  Current every day smoker.  Family History (Tanisha A. Manson PasseyBrown, RMA; 07/27/2017 10:11 AM) Depression  Family Members In General, Mother, Sister. Diabetes Mellitus  Family Members In General. Heart Disease  Family Members In General. Migraine Headache  Mother.  Pregnancy / Birth History (Tanisha A. Manson PasseyBrown, RMA; 07/27/2017 10:11 AM) Age at menarche  8 years. Contraceptive History  Intrauterine device. Gravida   2 Length (months) of breastfeeding  12-24 Maternal age  38-25 Para  3 Regular periods   Other Problems (Tanisha A. Manson PasseyBrown, RMA; 07/27/2017 10:11 AM) Anxiety Disorder  Cholelithiasis  Depression  Hemorrhoids  Migraine Headache     Review of Systems  General Not Present- Appetite Loss, Chills, Fatigue, Fever, Night Sweats, Weight Gain and Weight Loss. Skin Present- Dryness. Not Present- Change in Wart/Mole, Hives, Jaundice, New Lesions, Non-Healing Wounds, Rash and Ulcer. HEENT Present- Seasonal Allergies. Not Present- Earache, Hearing Loss, Hoarseness, Nose Bleed, Oral Ulcers, Ringing in the Ears, Sinus Pain, Sore Throat, Visual Disturbances, Wears glasses/contact lenses and Yellow Eyes. Respiratory Not Present- Bloody sputum, Chronic Cough, Difficulty Breathing, Snoring and Wheezing. Breast Not Present- Breast Mass, Breast Pain, Nipple Discharge and Skin Changes. Cardiovascular Not Present- Chest Pain, Difficulty Breathing Lying Down, Leg Cramps, Palpitations, Rapid Heart Rate, Shortness of Breath and Swelling of Extremities. Gastrointestinal Present- Hemorrhoids and Rectal Pain. Not Present- Abdominal Pain, Bloating, Bloody Stool, Change in Bowel Habits, Chronic diarrhea, Constipation, Difficulty Swallowing, Excessive gas, Gets full quickly at meals, Indigestion, Nausea and Vomiting. Female Genitourinary Not Present- Frequency, Nocturia, Painful Urination, Pelvic Pain and Urgency. Musculoskeletal Not Present- Back Pain, Joint Pain, Joint Stiffness, Muscle Pain, Muscle Weakness and Swelling of Extremities. Neurological Present- Headaches. Not Present- Decreased Memory, Fainting, Numbness, Seizures, Tingling, Tremor, Trouble walking and Weakness. Psychiatric Present- Anxiety, Bipolar, Change in Sleep Pattern and Depression. Not Present- Fearful and Frequent crying. Endocrine Not Present- Cold Intolerance, Excessive Hunger, Hair Changes, Heat Intolerance, Hot flashes and New  Diabetes. Hematology Not Present- Blood Thinners, Easy Bruising, Excessive bleeding, Gland problems, HIV and Persistent Infections.  BP (!) 139/91   Pulse 91   Temp 97.8  F (36.6 C) (Oral)   Resp 16   Wt 105.5 kg (232 lb 8 oz)   LMP 09/28/2017 (Approximate)   SpO2 100%   BMI 39.91 kg/m    Physical Exam  General Mental Status-Alert. General Appearance-Not in acute distress. Build & Nutrition-Well nourished. Posture-Normal posture. Gait-Normal.  Head and Neck Head-normocephalic, atraumatic with no lesions or palpable masses. Trachea-midline.  Chest and Lung Exam Chest and lung exam reveals -on auscultation, normal breath sounds, no adventitious sounds and normal vocal resonance.  Cardiovascular Cardiovascular examination reveals -normal heart sounds, regular rate and rhythm with no murmurs and no digital clubbing, cyanosis, edema, increased warmth or tenderness.  Abdomen Inspection Inspection of the abdomen reveals - No Hernias. Palpation/Percussion Palpation and Percussion of the abdomen reveal - Soft, Non Tender, No Rigidity (guarding), No hepatosplenomegaly and No Palpable abdominal masses.  Rectal Anorectal Exam External - Note: L posterior external opening just distal to dentate line.  Neurologic Neurologic evaluation reveals -alert and oriented x 3 with no impairment of recent or remote memory, normal attention span and ability to concentrate, normal sensation and normal coordination.  Musculoskeletal Normal Exam - Bilateral-Upper Extremity Strength Normal and Lower Extremity Strength Normal.    Assessment & Plan  ANAL FISTULA (K60.3) Impression: 36 year old female who presents to the office with a perirectal fistula.  Her seton has fallen out.  She is here to have that replaced.  Risks include bleeding and pain.

## 2017-10-09 NOTE — Op Note (Signed)
10/09/2017  9:39 AM  PATIENT:  Monica Jefferson  36 y.o. female  Patient Care Team: Midge Minium, MD as PCP - General (Family Medicine) Gustavo Lah, NP as Nurse Practitioner (Obstetrics and Gynecology)  PRE-OPERATIVE DIAGNOSIS:  Anal fistula  POST-OPERATIVE DIAGNOSIS:  Anal fistula  PROCEDURE:  Procedure(s): PLACEMENT OF SETON  SURGEON:  Surgeon(s): Leighton Ruff, MD  ASSISTANT: none   ANESTHESIA:   local and MAC  SPECIMEN:  No Specimen  DISPOSITION OF SPECIMEN:  N/A  COUNTS:  YES  PLAN OF CARE: Discharge to home after PACU  PATIENT DISPOSITION:  PACU - hemodynamically stable.  INDICATION: 36 y.o. F with anal fistula.  Here for seton replacement   OR FINDINGS: Posterior midline anal fistula.  DESCRIPTION: the patient was identified in the preoperative holding area and taken to the OR where they were laid on the operating room table.  MAC anesthesia was induced without difficulty. The patient was then positioned in prone jackknife position with buttocks gently taped apart.  The patient was then prepped and draped in usual sterile fashion.  SCDs were noted to be in place prior to the initiation of anesthesia. A surgical timeout was performed indicating the correct patient, procedure, positioning and need for preoperative antibiotics.  A rectal block was performed using Marcaine with epinephrine.    I began with a digital rectal exam.  This was normal.  She had good sphincter tone.  I then placed a Hill-Ferguson anoscope into the anal canal and evaluated this completely.  I placed an S shaped fistula probe into the external opening at posterior midline and this exited into the internal canal at the dentate line.  An Ethibond suture was pulled back through the fistula and this was used to pull a vessel loop through.  I then used Ethibond suture to fashion a loop out of the vessel loop.  She tolerated the procedure well.  All counts were correct per operating room  staff.

## 2017-10-09 NOTE — Discharge Instructions (Addendum)

## 2017-10-09 NOTE — Anesthesia Postprocedure Evaluation (Signed)
Anesthesia Post Note  Patient: Monica Jefferson  Procedure(s) Performed: PLACEMENT OF SETON (N/A Anus)     Patient location during evaluation: PACU Anesthesia Type: MAC Level of consciousness: awake and alert Pain management: pain level controlled Vital Signs Assessment: post-procedure vital signs reviewed and stable Respiratory status: spontaneous breathing, nonlabored ventilation, respiratory function stable and patient connected to nasal cannula oxygen Cardiovascular status: stable and blood pressure returned to baseline Postop Assessment: no apparent nausea or vomiting Anesthetic complications: no    Last Vitals:  Vitals:   10/09/17 0734 10/09/17 0943  BP: (!) 139/91 (!) 109/99  Pulse: 91 84  Resp: 16 16  Temp: 36.6 C 36.6 C  SpO2: 100% 100%    Last Pain:  Vitals:   10/09/17 0734  TempSrc: Oral                 Hitomi Slape

## 2017-10-09 NOTE — Transfer of Care (Signed)
Immediate Anesthesia Transfer of Care Note  Patient: Monica Jefferson  Procedure(s) Performed: PLACEMENT OF SETON (N/A Anus)  Patient Location: PACU  Anesthesia Type:MAC  Level of Consciousness: awake, alert , oriented and patient cooperative  Airway & Oxygen Therapy: Patient Spontanous Breathing  Post-op Assessment: Report given to RN and Post -op Vital signs reviewed and stable  Post vital signs: Reviewed and stable  Last Vitals:  Vitals:   10/09/17 0734 10/09/17 0943  BP: (!) 139/91 (!) 109/99  Pulse: 91 84  Resp: 16 16  Temp: 36.6 C 36.6 C  SpO2: 100% 100%    Last Pain:  Vitals:   10/09/17 0734  TempSrc: Oral      Patients Stated Pain Goal: 7 (97/02/63 7858)  Complications: No apparent anesthesia complications

## 2017-10-12 ENCOUNTER — Encounter (HOSPITAL_BASED_OUTPATIENT_CLINIC_OR_DEPARTMENT_OTHER): Payer: Self-pay | Admitting: General Surgery

## 2017-10-27 ENCOUNTER — Encounter: Payer: Self-pay | Admitting: General Practice

## 2017-10-27 ENCOUNTER — Telehealth: Payer: Self-pay | Admitting: Family Medicine

## 2017-10-27 DIAGNOSIS — G4700 Insomnia, unspecified: Secondary | ICD-10-CM

## 2017-10-27 MED ORDER — ZOLPIDEM TARTRATE ER 12.5 MG PO TBCR: 12.5000 mg | EXTENDED_RELEASE_TABLET | Freq: Every evening | ORAL | 0 refills | Status: DC | PRN

## 2017-10-27 NOTE — Telephone Encounter (Signed)
Spoke with pt, she was advised that CSC and prescription are at the front desk. Once CSC is completed she may have the prescription.

## 2017-10-27 NOTE — Telephone Encounter (Signed)
Ok for #90, will need to come in to sign Controlled Substance Agreement and take prescription to pharmacy

## 2017-10-27 NOTE — Telephone Encounter (Signed)
Copied from CRM [id number]. Topic: Quick Communication - See Telephone Encounter >> Oct 27, 2017 11:00 AM Viviann SpareWhite, Selina wrote: Patient is requesting a refill on her Zolpidem (Ambien) 12.5 mg. Please send to the [facility] at [address] Please call patient with an appt if she need to come in to see the doctor.

## 2017-10-27 NOTE — Telephone Encounter (Signed)
Last OV 06/10/17 (anxiety) Zolpidem last filled 08/03/17 #90 with 0  NO [facility]SC on file

## 2018-01-07 ENCOUNTER — Other Ambulatory Visit: Payer: Self-pay | Admitting: Family Medicine

## 2018-01-15 ENCOUNTER — Ambulatory Visit: Payer: Self-pay | Admitting: General Surgery

## 2018-01-15 ENCOUNTER — Other Ambulatory Visit: Payer: Self-pay | Admitting: General Surgery

## 2018-01-15 ENCOUNTER — Encounter (HOSPITAL_BASED_OUTPATIENT_CLINIC_OR_DEPARTMENT_OTHER): Payer: Self-pay

## 2018-01-15 ENCOUNTER — Other Ambulatory Visit: Payer: Self-pay

## 2018-01-15 NOTE — Progress Notes (Signed)
Spoke with:  Deanna ArtisKeisha NPO:  After Midnight, no gum, candy, or mints  NO SMOKING Arrival time:  0815 Labs: Urine pregnancy, Hemoglobin AM medications:  None Pre op orders:  Yes Ride home: Loraine LericheMark (husband) [phone]

## 2018-01-21 ENCOUNTER — Ambulatory Visit (HOSPITAL_COMMUNITY): Payer: BLUE CROSS/BLUE SHIELD

## 2018-01-21 ENCOUNTER — Encounter (HOSPITAL_BASED_OUTPATIENT_CLINIC_OR_DEPARTMENT_OTHER): Payer: Self-pay | Admitting: Anesthesiology

## 2018-01-21 ENCOUNTER — Ambulatory Visit (HOSPITAL_BASED_OUTPATIENT_CLINIC_OR_DEPARTMENT_OTHER): Payer: BLUE CROSS/BLUE SHIELD | Admitting: Anesthesiology

## 2018-01-21 ENCOUNTER — Ambulatory Visit (HOSPITAL_BASED_OUTPATIENT_CLINIC_OR_DEPARTMENT_OTHER)
Admission: RE | Admit: 2018-01-21 | Discharge: 2018-01-21 | Disposition: A | Discharge status: Home / Self Care | Payer: BLUE CROSS/BLUE SHIELD | Source: Ambulatory Visit | Attending: General Surgery | Admitting: General Surgery

## 2018-01-21 ENCOUNTER — Encounter (HOSPITAL_BASED_OUTPATIENT_CLINIC_OR_DEPARTMENT_OTHER)
Admission: RE | Disposition: A | Discharge status: Home / Self Care | Payer: Self-pay | Source: Ambulatory Visit | Attending: General Surgery

## 2018-01-21 DIAGNOSIS — K603 Anal fistula: Secondary | ICD-10-CM | POA: Diagnosis present

## 2018-01-21 DIAGNOSIS — Z79899 Other long term (current) drug therapy: Secondary | ICD-10-CM | POA: Insufficient documentation

## 2018-01-21 DIAGNOSIS — Z9884 Bariatric surgery status: Secondary | ICD-10-CM | POA: Insufficient documentation

## 2018-01-21 DIAGNOSIS — Z6839 Body mass index (BMI) 39.0-39.9, adult: Secondary | ICD-10-CM | POA: Insufficient documentation

## 2018-01-21 DIAGNOSIS — G4730 Sleep apnea, unspecified: Secondary | ICD-10-CM | POA: Diagnosis not present

## 2018-01-21 DIAGNOSIS — F17200 Nicotine dependence, unspecified, uncomplicated: Secondary | ICD-10-CM | POA: Insufficient documentation

## 2018-01-21 DIAGNOSIS — F319 Bipolar disorder, unspecified: Secondary | ICD-10-CM | POA: Diagnosis not present

## 2018-01-21 HISTORY — PX: LIGATION OF INTERNAL FISTULA TRACT: SHX6551

## 2018-01-21 LAB — ECHO INTRAOPERATIVE TEE
Height: 64 in
Weight: 3716.8 oz

## 2018-01-21 LAB — POCT HEMOGLOBIN-HEMACUE: Hemoglobin: 12.4 g/dL (ref 12.0–15.0)

## 2018-01-21 LAB — POCT PREGNANCY, URINE: Preg Test, Ur: NEGATIVE

## 2018-01-21 SURGERY — LIGATION, INTERNAL FISTULA TRACT
Anesthesia: General | Site: Rectum

## 2018-01-21 MED ORDER — SCOPOLAMINE 1 MG/3DAYS TD PT72
1.0000 | MEDICATED_PATCH | TRANSDERMAL | Status: DC
  Filled 2018-01-21: qty 1

## 2018-01-21 MED ORDER — KETAMINE HCL 10 MG/ML IJ SOLN
INTRAMUSCULAR | Status: DC | PRN
  Administered 2018-01-21: 20 mg via INTRAVENOUS

## 2018-01-21 MED ORDER — DEXAMETHASONE SODIUM PHOSPHATE 10 MG/ML IJ SOLN
INTRAMUSCULAR | Status: AC
  Filled 2018-01-21: qty 1

## 2018-01-21 MED ORDER — HYDROMORPHONE HCL 1 MG/ML IJ SOLN
0.2500 mg | INTRAMUSCULAR | Status: DC | PRN
  Filled 2018-01-21: qty 0.5

## 2018-01-21 MED ORDER — ROCURONIUM BROMIDE 10 MG/ML (PF) SYRINGE
PREFILLED_SYRINGE | INTRAVENOUS | Status: DC | PRN
  Administered 2018-01-21: 50 mg via INTRAVENOUS

## 2018-01-21 MED ORDER — PROPOFOL 10 MG/ML IV BOLUS
INTRAVENOUS | Status: AC
  Filled 2018-01-21: qty 20

## 2018-01-21 MED ORDER — CELECOXIB 200 MG PO CAPS
200.0000 mg | ORAL_CAPSULE | ORAL | Status: AC
  Administered 2018-01-21: 200 mg via ORAL
  Filled 2018-01-21: qty 1

## 2018-01-21 MED ORDER — SUGAMMADEX SODIUM 200 MG/2ML IV SOLN
INTRAVENOUS | Status: AC
  Filled 2018-01-21: qty 2

## 2018-01-21 MED ORDER — SUCCINYLCHOLINE CHLORIDE 200 MG/10ML IV SOSY
PREFILLED_SYRINGE | INTRAVENOUS | Status: AC
  Filled 2018-01-21: qty 10

## 2018-01-21 MED ORDER — ROCURONIUM BROMIDE 10 MG/ML (PF) SYRINGE
PREFILLED_SYRINGE | INTRAVENOUS | Status: AC
  Filled 2018-01-21: qty 5

## 2018-01-21 MED ORDER — CELECOXIB 200 MG PO CAPS
ORAL_CAPSULE | ORAL | Status: AC
  Filled 2018-01-21: qty 1

## 2018-01-21 MED ORDER — GABAPENTIN 300 MG PO CAPS
ORAL_CAPSULE | ORAL | Status: AC
  Filled 2018-01-21: qty 1

## 2018-01-21 MED ORDER — KETOROLAC TROMETHAMINE 30 MG/ML IJ SOLN
30.0000 mg | Freq: Once | INTRAMUSCULAR | Status: DC | PRN
  Administered 2018-01-21: 30 mg via INTRAVENOUS
  Filled 2018-01-21: qty 1

## 2018-01-21 MED ORDER — ONDANSETRON HCL 4 MG/2ML IJ SOLN
INTRAMUSCULAR | Status: DC | PRN
  Administered 2018-01-21: 4 mg via INTRAVENOUS

## 2018-01-21 MED ORDER — ONDANSETRON HCL 4 MG/2ML IJ SOLN
INTRAMUSCULAR | Status: AC
  Filled 2018-01-21: qty 2

## 2018-01-21 MED ORDER — SUCCINYLCHOLINE CHLORIDE 200 MG/10ML IV SOSY
PREFILLED_SYRINGE | INTRAVENOUS | Status: DC | PRN
  Administered 2018-01-21: 100 mg via INTRAVENOUS

## 2018-01-21 MED ORDER — LIDOCAINE 2% (20 MG/ML) 5 ML SYRINGE
INTRAMUSCULAR | Status: DC | PRN
  Administered 2018-01-21: 80 mg via INTRAVENOUS

## 2018-01-21 MED ORDER — SCOPOLAMINE 1 MG/3DAYS TD PT72
MEDICATED_PATCH | TRANSDERMAL | Status: DC | PRN
  Administered 2018-01-21: 1 via TRANSDERMAL

## 2018-01-21 MED ORDER — GABAPENTIN 300 MG PO CAPS
300.0000 mg | ORAL_CAPSULE | ORAL | Status: AC
  Administered 2018-01-21: 300 mg via ORAL
  Filled 2018-01-21: qty 1

## 2018-01-21 MED ORDER — SCOPOLAMINE 1 MG/3DAYS TD PT72
MEDICATED_PATCH | TRANSDERMAL | Status: AC
  Filled 2018-01-21: qty 1

## 2018-01-21 MED ORDER — DEXAMETHASONE SODIUM PHOSPHATE 10 MG/ML IJ SOLN
INTRAMUSCULAR | Status: DC | PRN
  Administered 2018-01-21: 10 mg via INTRAVENOUS

## 2018-01-21 MED ORDER — OXYCODONE HCL 5 MG PO TABS: 5.0000 mg | ORAL_TABLET | Freq: Four times a day (QID) | ORAL | 0 refills | Status: DC | PRN

## 2018-01-21 MED ORDER — MIDAZOLAM HCL 2 MG/2ML IJ SOLN
INTRAMUSCULAR | Status: AC
  Filled 2018-01-21: qty 2

## 2018-01-21 MED ORDER — ACETAMINOPHEN 500 MG PO TABS
ORAL_TABLET | ORAL | Status: AC
  Filled 2018-01-21: qty 2

## 2018-01-21 MED ORDER — FENTANYL CITRATE (PF) 100 MCG/2ML IJ SOLN
INTRAMUSCULAR | Status: DC | PRN
  Administered 2018-01-21: 100 ug via INTRAVENOUS

## 2018-01-21 MED ORDER — ACETAMINOPHEN 500 MG PO TABS
1000.0000 mg | ORAL_TABLET | ORAL | Status: AC
  Administered 2018-01-21: 1000 mg via ORAL
  Filled 2018-01-21: qty 2

## 2018-01-21 MED ORDER — ONDANSETRON HCL 4 MG/2ML IJ SOLN
4.0000 mg | Freq: Once | INTRAMUSCULAR | Status: DC | PRN
  Filled 2018-01-21: qty 2

## 2018-01-21 MED ORDER — SUGAMMADEX SODIUM 200 MG/2ML IV SOLN
INTRAVENOUS | Status: DC | PRN
  Administered 2018-01-21: 200 mg via INTRAVENOUS

## 2018-01-21 MED ORDER — PROPOFOL 10 MG/ML IV BOLUS
INTRAVENOUS | Status: DC | PRN
  Administered 2018-01-21: 200 mg via INTRAVENOUS

## 2018-01-21 MED ORDER — BUPIVACAINE-EPINEPHRINE 0.5% -1:200000 IJ SOLN
INTRAMUSCULAR | Status: DC | PRN
  Administered 2018-01-21: 30 mL

## 2018-01-21 MED ORDER — LIDOCAINE 2% (20 MG/ML) 5 ML SYRINGE
INTRAMUSCULAR | Status: AC
  Filled 2018-01-21: qty 5

## 2018-01-21 MED ORDER — ARTIFICIAL TEARS OPHTHALMIC OINT
TOPICAL_OINTMENT | OPHTHALMIC | Status: AC
  Filled 2018-01-21: qty 3.5

## 2018-01-21 MED ORDER — MIDAZOLAM HCL 2 MG/2ML IJ SOLN
INTRAMUSCULAR | Status: DC | PRN
  Administered 2018-01-21: 2 mg via INTRAVENOUS

## 2018-01-21 MED ORDER — LACTATED RINGERS IV SOLN
INTRAVENOUS | Status: DC
  Administered 2018-01-21: 09:00:00 via INTRAVENOUS
  Filled 2018-01-21: qty 1000

## 2018-01-21 SURGICAL SUPPLY — 50 items
BENZOIN TINCTURE PRP APPL 2/3 (GAUZE/BANDAGES/DRESSINGS) ×4 IMPLANT
BLADE EXTENDED COATED 6.5IN (ELECTRODE) IMPLANT
BLADE HEX COATED 2.75 (ELECTRODE) ×2 IMPLANT
BLADE SURG 10 STRL SS (BLADE) IMPLANT
BLADE SURG 15 STRL LF DISP TIS (BLADE) ×1 IMPLANT
BLADE SURG 15 STRL SS (BLADE) ×1
BRIEF STRETCH FOR OB PAD LRG (UNDERPADS AND DIAPERS) ×2 IMPLANT
CANISTER SUCT 3000ML PPV (MISCELLANEOUS) ×2 IMPLANT
COVER BACK TABLE 60X90IN (DRAPES) ×2 IMPLANT
COVER MAYO STAND STRL (DRAPES) ×2 IMPLANT
DRAPE LAPAROTOMY 100X72 PEDS (DRAPES) ×2 IMPLANT
DRAPE UTILITY XL STRL (DRAPES) ×2 IMPLANT
DRSG PAD ABDOMINAL 8X10 ST (GAUZE/BANDAGES/DRESSINGS) ×2 IMPLANT
ELECT REM PT RETURN 9FT ADLT (ELECTROSURGICAL) ×2
ELECTRODE REM PT RTRN 9FT ADLT (ELECTROSURGICAL) ×1 IMPLANT
GAUZE SPONGE 4X4 12PLY STRL (GAUZE/BANDAGES/DRESSINGS) ×2 IMPLANT
GAUZE SPONGE 4X4 12PLY STRL LF (GAUZE/BANDAGES/DRESSINGS) ×2 IMPLANT
GAUZE SPONGE 4X4 16PLY XRAY LF (GAUZE/BANDAGES/DRESSINGS) IMPLANT
GLOVE BIO SURGEON STRL SZ 6.5 (GLOVE) ×2 IMPLANT
GLOVE INDICATOR 7.0 STRL GRN (GLOVE) ×2 IMPLANT
GOWN SPEC L3 XXLG W/TWL (GOWN DISPOSABLE) ×2 IMPLANT
HYDROGEN PEROXIDE 16OZ (MISCELLANEOUS) ×2 IMPLANT
IV CATH 18G SAFETY (IV SOLUTION) ×2 IMPLANT
KIT RM TURNOVER CYSTO AR (KITS) ×2 IMPLANT
NEEDLE HYPO 22GX1.5 SAFETY (NEEDLE) ×2 IMPLANT
NS IRRIG 500ML POUR BTL (IV SOLUTION) ×2 IMPLANT
PACK BASIN DAY SURGERY FS (CUSTOM PROCEDURE TRAY) ×2 IMPLANT
PAD ABD 8X10 STRL (GAUZE/BANDAGES/DRESSINGS) ×2 IMPLANT
PAD ARMBOARD 7.5X6 YLW CONV (MISCELLANEOUS) IMPLANT
PENCIL BUTTON HOLSTER BLD 10FT (ELECTRODE) ×2 IMPLANT
RETRACTOR STAY HOOK 5MM (MISCELLANEOUS) ×2 IMPLANT
RETRACTOR STERILE 25.8CMX11.3 (INSTRUMENTS) ×2 IMPLANT
SPONGE SURGIFOAM ABS GEL 12-7 (HEMOSTASIS) IMPLANT
SUCTION FRAZIER HANDLE 10FR (MISCELLANEOUS) ×1
SUCTION TUBE FRAZIER 10FR DISP (MISCELLANEOUS) ×1 IMPLANT
SUT CHROMIC 2 0 SH (SUTURE) ×4 IMPLANT
SUT CHROMIC 3 0 SH 27 (SUTURE) IMPLANT
SUT SILK 2 0 (SUTURE) ×1
SUT SILK 2-0 18XBRD TIE 12 (SUTURE) ×1 IMPLANT
SUT SILK 3 0 SH 30 (SUTURE) IMPLANT
SUT VIC AB 2-0 SH 27 (SUTURE)
SUT VIC AB 2-0 SH 27XBRD (SUTURE) IMPLANT
SUT VIC AB 3-0 SH 8-18 (SUTURE) ×2 IMPLANT
SUT VICRYL 3 0 UR 6 27 (SUTURE) IMPLANT
SYR BULB IRRIGATION 50ML (SYRINGE) ×2 IMPLANT
SYR CONTROL 10ML LL (SYRINGE) ×2 IMPLANT
TOWEL OR 17X24 6PK STRL BLUE (TOWEL DISPOSABLE) ×2 IMPLANT
TRAY DSU PREP LF (CUSTOM PROCEDURE TRAY) ×2 IMPLANT
TUBE CONNECTING 12X1/4 (SUCTIONS) ×2 IMPLANT
YANKAUER SUCT BULB TIP NO VENT (SUCTIONS) ×2 IMPLANT

## 2018-01-21 NOTE — Discharge Instructions (Addendum)
Post Anesthesia Home Care Instructions  Activity: Get plenty of rest for the remainder of the day. A responsible individual must stay with you for 24 hours following the procedure.  For the next 24 hours, DO NOT: -Drive a car -Operate machinery -Drink alcoholic beverages -Take any medication unless instructed by your physician -Make any legal decisions or sign important papers.  Meals: Start with liquid foods such as gelatin or soup. Progress to regular foods as tolerated. Avoid greasy, spicy, heavy foods. If nausea and/or vomiting occur, drink only clear liquids until the nausea and/or vomiting subsides. Call your physician if vomiting continues.  Special Instructions/Symptoms: Your throat may feel dry or sore from the anesthesia or the breathing tube placed in your throat during surgery. If this causes discomfort, gargle with warm salt water. The discomfort should disappear within 24 hours.  If you had a scopolamine patch placed behind your ear for the management of post- operative nausea and/or vomiting:  1. The medication in the patch is effective for 72 hours, after which it should be removed.  Wrap patch in a tissue and discard in the trash. Wash hands thoroughly with soap and water. 2. You may remove the patch earlier than 72 hours if you experience unpleasant side effects which may include dry mouth, dizziness or visual disturbances. 3. Avoid touching the patch. Wash your hands with soap and water after contact with the patch.    ANORECTAL SURGERY: POST OP INSTRUCTIONS 1. Take your usually prescribed home medications unless otherwise directed. 2. DIET: During the first few hours after surgery sip on some liquids until you are able to urinate.  It is normal to not urinate for several hours after this surgery.  If you feel uncomfortable, please contact the office for instructions.  After you are able to urinate,you may eat, if you feel like it.  Follow a light bland diet the first 24  hours after arrival home, such as soup, liquids, crackers, etc.  Be sure to include lots of fluids daily (6-8 glasses).  Avoid fast food or heavy meals, as your are more likely to get nauseated.  Eat a low fat diet the next few days after surgery.  Limit caffeine intake to 1-2 servings a day. 3. PAIN CONTROL: a. Pain is best controlled by a usual combination of several different methods TOGETHER: i. Muscle relaxation: Soak in a warm bath (or Sitz bath) three times a day and after bowel movements.  Continue to do this until all pain is resolved. ii. Over the counter pain medication iii. Prescription pain medication b. Most patients will experience some swelling and discomfort in the anus/rectal area and incisions.  Heat such as warm towels, sitz baths, warm baths, etc to help relax tight/sore spots and speed recovery.  Some people prefer to use ice, especially in the first couple days after surgery, as it may decrease the pain and swelling, or alternate between ice & heat.  Experiment to what works for you.  Swelling and bruising can take several weeks to resolve.  Pain can take even longer to completely resolve. c. It is helpful to take an over-the-counter pain medication regularly for the first few weeks.  Choose one of the following that works best for you: i. Naproxen (Aleve, etc)  Two 220mg tabs twice a day ii. Ibuprofen (Advil, etc) Three 200mg tabs four times a day (every meal & bedtime) d. A  prescription for pain medication (such as percocet, oxycodone, hydrocodone, etc) should be given to you   upon discharge.  Take your pain medication as prescribed.  i. If you are having problems/concerns with the prescription medicine (does not control pain, nausea, vomiting, rash, itching, etc), please call us [phone] to see if we need to switch you to a different pain medicine that will work better for you and/or control your side effect better. ii. If you need a refill on your pain medication, please  contact your pharmacy.  They will contact our office to request authorization. Prescriptions will not be filled after 5 pm or on week-ends. 4. KEEP YOUR BOWELS REGULAR and AVOID CONSTIPATION a. The goal is one to two soft bowel movements a day.  You should at least have a bowel movement every other day. b. Avoid getting constipated.  Between the surgery and the pain medications, it is common to experience some constipation. This can be very painful after rectal surgery.  Increasing fluid intake and taking a fiber supplement (such as Metamucil, Citrucel, FiberCon, etc) 1-2 times a day regularly will usually help prevent this problem from occurring.  A stool softener like colace is also recommended.  This can be purchased over the counter at your pharmacy.  You can take it up to 3 times a day.  If you do not have a bowel movement after 24 hrs since your surgery, take one does of milk of magnesia.  If you still haven't had a bowel movement 8-12 hours after that dose, take another dose.  If you don't have a bowel movement 48 hrs after surgery, purchase a Fleets enema from the drug store and administer gently per package instructions.  If you still are having trouble with your bowel movements after that, please call the office for further instructions. c. If you develop diarrhea or have many loose bowel movements, simplify your diet to bland foods & liquids for a few days.  Stop any stool softeners and decrease your fiber supplement.  Switching to mild anti-diarrheal medications (Kayopectate, Pepto Bismol) can help.  If this worsens or does not improve, please call us.  5. Wound Care a. Remove your bandages before your first bowel movement or 8 hours after surgery.     b. Remove any wound packing material at this tim,e as well.  You do not need to repack the wound unless instructed otherwise.  Wear an absorbent pad or soft cotton gauze in your underwear to catch any drainage and help keep the area clean. You  should change this every 2-3 hours while awake. c. Keep the area clean and dry.  Bathe / shower every day, especially after bowel movements.  Keep the area clean by showering / bathing over the incision / wound.   It is okay to soak an open wound to help wash it.  Wet wipes or showers / gentle washing after bowel movements is often less traumatic than regular toilet paper. d. You may have some styrofoam-like soft packing in the rectum which will come out with the first bowel movement.  e. You will often notice bleeding with bowel movements.  This should slow down by the end of the first week of surgery f. Expect some drainage.  This should slow down, too, by the end of the first week of surgery.  Wear an absorbent pad or soft cotton gauze in your underwear until the drainage stops. g. Do Not sit on a rubber or pillow ring.  This can make you symptoms worse.  You may sit on a soft pillow if needed.    6. ACTIVITIES as tolerated:   a. You may resume regular (light) daily activities beginning the next day--such as daily self-care, walking, climbing stairs--gradually increasing activities as tolerated.  If you can walk 30 minutes without difficulty, it is safe to try more intense activity such as jogging, treadmill, bicycling, low-impact aerobics, swimming, etc. b. Save the most intensive and strenuous activity for last such as sit-ups, heavy lifting, contact sports, etc  Refrain from any heavy lifting or straining until you are off narcotics for pain control.   c. You may drive when you are no longer taking prescription pain medication, you can comfortably sit for long periods of time, and you can safely maneuver your car and apply brakes. d. You may have sexual intercourse when it is comfortable.  7. FOLLOW UP in our office a. Please call [facility] at [phone] to set up an appointment to see your surgeon in the office for a follow-up appointment approximately 3-4 weeks after your surgery. b. Make sure that  you call for this appointment the day you arrive home to insure a convenient appointment time. 10. IF YOU HAVE DISABILITY OR FAMILY LEAVE FORMS, BRING THEM TO THE OFFICE FOR PROCESSING.  DO NOT GIVE THEM TO YOUR DOCTOR.     WHEN TO CALL US [phone]: 1. Poor pain control 2. Reactions / problems with new medications (rash/itching, nausea, etc)  3. Fever over 101.5 F (38.5 C) 4. Inability to urinate 5. Nausea and/or vomiting 6. Worsening swelling or bruising 7. Continued bleeding from incision. 8. Increased pain, redness, or drainage from the incision  The clinic staff is available to answer your questions during regular business hours (8:30am-5pm).  Please don't hesitate to call and ask to speak to one of our nurses for clinical concerns.   A surgeon from [facility] is always on call at the hospitals   If you have a medical emergency, go to the nearest emergency room or call 911.    [facility] [address], [address], [place], [place]  [address] ? MAIN: [phone] ? TOLL FREE: [phone] ? FAX [phone] [email or web address]    

## 2018-01-21 NOTE — H&P (Addendum)
The patient is a 37 year old female who presents with a complaint of anal problems. Patient reports at least a one-year history of perirectal lesion. She describes a nodular lesion that drained pus and occasionally blood. It became irritated and painful at times. She has never had any anal rectal procedures in the past. She denies any history of perirectal abscess. She reports regular bowel habits. She underwent an EUA in Sept and this showed a fistula.  Monica Jefferson was placed.  This fell out and was replaced in late Oct.  She is here today for definitive repair.  Past Surgical History Foot Surgery  Bilateral. Gallbladder Surgery - Laparoscopic  Gastric Bypass  Lap Band  Oral Surgery  Tonsillectomy   Diagnostic Studies History  Colonoscopy  never Mammogram  never Pap Smear  1-5 years ago  Allergies  No Known Drug Allergies 07/27/2017 Allergies Reconciled   Medication History  Zolpidem Tartrate ER (12.5MG  Tablet ER, Oral) Active. Montelukast Sodium (10MG  Tablet, Oral) Active. QUEtiapine Fumarate (50MG  Tablet, Oral) Active. Medications Reconciled  Social History  Alcohol use  Occasional alcohol use. Caffeine use  Carbonated beverages. Illicit drug use  Remotely quit drug use. Tobacco use  Current every day smoker.  Family History  Depression  Family Members In General, Mother, Sister. Diabetes Mellitus  Family Members In General. Heart Disease  Family Members In General. Migraine Headache  Mother.  Pregnancy / Birth History  Age at menarche  8 years. Contraceptive History  Intrauterine device. Gravida  2 Length (months) of breastfeeding  12-24 Maternal age  64-25 Para  3 Regular periods   Other Problems  Anxiety Disorder  Cholelithiasis  Depression  Hemorrhoids  Migraine Headache     Review of Systems  General Not Present- Appetite Loss, Chills, Fatigue, Fever, Night Sweats, Weight Gain and Weight Loss. Skin  Present- Dryness. Not Present- Change in Wart/Mole, Hives, Jaundice, New Lesions, Non-Healing Wounds, Rash and Ulcer. HEENT Present- Seasonal Allergies. Not Present- Earache, Hearing Loss, Hoarseness, Nose Bleed, Oral Ulcers, Ringing in the Ears, Sinus Pain, Sore Throat, Visual Disturbances, Wears glasses/contact lenses and Yellow Eyes. Respiratory Not Present- Bloody sputum, Chronic Cough, Difficulty Breathing, Snoring and Wheezing. Breast Not Present- Breast Mass, Breast Pain, Nipple Discharge and Skin Changes. Cardiovascular Not Present- Chest Pain, Difficulty Breathing Lying Down, Leg Cramps, Palpitations, Rapid Heart Rate, Shortness of Breath and Swelling of Extremities. Gastrointestinal Present- Hemorrhoids and Rectal Pain. Not Present- Abdominal Pain, Bloating, Bloody Stool, Change in Bowel Habits, Chronic diarrhea, Constipation, Difficulty Swallowing, Excessive gas, Gets full quickly at meals, Indigestion, Nausea and Vomiting. Female Genitourinary Not Present- Frequency, Nocturia, Painful Urination, Pelvic Pain and Urgency. Musculoskeletal Not Present- Back Pain, Joint Pain, Joint Stiffness, Muscle Pain, Muscle Weakness and Swelling of Extremities. Neurological Present- Headaches. Not Present- Decreased Memory, Fainting, Numbness, Seizures, Tingling, Tremor, Trouble walking and Weakness. Psychiatric Present- Anxiety, Bipolar, Change in Sleep Pattern and Depression. Not Present- Fearful and Frequent crying. Endocrine Not Present- Cold Intolerance, Excessive Hunger, Hair Changes, Heat Intolerance, Hot flashes and New Diabetes. Hematology Not Present- Blood Thinners, Easy Bruising, Excessive bleeding, Gland problems, HIV and Persistent Infections.  BP 132/84 (BP Location: Left Arm, Patient Position: Sitting)   Pulse 74   Temp 98.4 F (36.9 C) (Oral)   Resp 18   Ht 5\' 4"  (1.626 m)   Wt 105.4 kg (232 lb 4.8 oz)   LMP 01/18/2018 (Within Days)   SpO2 100%   BMI 39.87 kg/m    Physical Exam   General Mental Status-Alert. General Appearance-Not  in acute distress. Build & Nutrition-Well nourished. Posture-Normal posture. Gait-Normal.  Head and Neck Head-normocephalic, atraumatic with no lesions or palpable masses. Trachea-midline.  Chest and Lung Exam Chest and lung exam reveals -on auscultation, normal breath sounds, no adventitious sounds and normal vocal resonance.  Cardiovascular Cardiovascular examination reveals -normal heart sounds, regular rate and rhythm with no murmurs and no digital clubbing, cyanosis, edema, increased warmth or tenderness.  Abdomen Inspection Inspection of the abdomen reveals - No Hernias. Palpation/Percussion Palpation and Percussion of the abdomen reveal - Soft, Non Tender, No Rigidity (guarding), No hepatosplenomegaly and No Palpable abdominal masses.  Rectal Anorectal Exam External - Note: L posterior seton  Neurologic Neurologic evaluation reveals -alert and oriented x 3 with no impairment of recent or remote memory, normal attention span and ability to concentrate, normal sensation and normal coordination.  Musculoskeletal Normal Exam - Bilateral-Upper Extremity Strength Normal and Lower Extremity Strength Normal.    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  She is here for a LIFT procedure.  This has been explained in detail.  Recurrence rate is ~20%.  Risk of incontinence is very low.  Other risks include bleeding and pain.  I believer she understands this and agrees to proceed.

## 2018-01-21 NOTE — Op Note (Addendum)
01/21/2018  11:01 AM  PATIENT:  Monica Jefferson  37 y.o. female  Patient Care Team: Sheliah Hatchabori, Katherine E, MD as PCP - General (Family Medicine) Arlana LindauFisher, Julie, NP as Nurse Practitioner (Obstetrics and Gynecology)  PRE-OPERATIVE DIAGNOSIS:  anal fistula  POST-OPERATIVE DIAGNOSIS:  anal fistula  PROCEDURE:  LIGATION OF INTERNAL FISTULA TRACT  Surgeon(s): Romie Leveehomas, Felesha Moncrieffe, MD  ASSISTANT: none   ANESTHESIA:   local and general  SPECIMEN:  No Specimen  DISPOSITION OF SPECIMEN:  N/A  COUNTS:  YES  PLAN OF CARE: Discharge to home after PACU  PATIENT DISPOSITION:  PACU - hemodynamically stable.  INDICATION: 37 year old female who presents to the office with an anorectal fistula.  She is status post seton placement last fall.  She is here today for her secondary procedure.  I have recommended a ligation of internal fistula tract.  The risk and benefits were explained to her prior to the OR  placed on chart.   OR FINDINGS: Left posterior transsphincteric anal fistula  DESCRIPTION: the patient was identified in the preoperative holding area and taken to the OR where they were laid on the operating room table.  General anesthesia was induced without difficulty. The patient was then positioned in prone jackknife position with buttocks gently taped apart.  The patient was then prepped and draped in usual sterile fashion.  SCDs were noted to be in place prior to the initiation of anesthesia. A surgical timeout was performed indicating the correct patient, procedure, positioning and need for preoperative antibiotics.  A rectal block was performed using Marcaine with epinephrine mixed with Exparel.    I began with a digital rectal exam.  There were no rectal masses.  I then placed a Hill-Ferguson anoscope into the anal canal and evaluated this completely.  The seton was in place with a posterior midline internal opening.  I began by making an incision over the intersphincteric groove using a 15  blade scalpel.  This was carried down into the intersphincteric groove using blunt dissection.  I dissected underneath of the fistula tract by lifting it up with a fistula probe.  Once I had encircled the entire fistula tract with my dissection plane, I tied off each end of the fistula using a 2-0 silk suture.  Once this was complete each suture was oversewn with a 2-0 Vicryl suture.  I then closed the internal defect using several 2-0 Vicryl sutures as well as a 2-0 chromic suture internally.  I tested the fistula with hydrogen peroxide.  There was no leak noted.  I then closed the intersphincteric space after adequate irrigation.  This was done using 2-0 Vicryl sutures.  2-0 chromic sutures were used to reapproximate the skin.  The external fistula site was excised using a 15 blade scalpel to allow for adequate drainage.  Lidocaine ointment and sterile dressing were applied.  Patient was then awakened from anesthesia and sent to postanesthesia care unit in stable condition.  All counts were correct per operating room staff.   I reviewed the [employer] database and I see no other narcotic prescriptions except for the ones that were given to her for her previous surgery.

## 2018-01-21 NOTE — Transfer of Care (Signed)
Immediate Anesthesia Transfer of Care Note  Patient: Monica Jefferson  Procedure(s) Performed: LIGATION OF INTERNAL FISTULA TRACT (N/A Rectum)  Patient Location: PACU  Anesthesia Type:General  Level of Consciousness: awake, alert , oriented and patient cooperative  Airway & Oxygen Therapy: Patient Spontanous Breathing and Patient connected to nasal cannula oxygen  Post-op Assessment: Report given to RN and Post -op Vital signs reviewed and stable  Post vital signs: Reviewed and stable  Last Vitals:  Vitals:   01/21/18 0811  BP: 132/84  Pulse: 74  Resp: 18  Temp: 36.9 C  SpO2: 100%    Last Pain:  Vitals:   01/21/18 0814  TempSrc: Oral      Patients Stated Pain Goal: 0 (01/21/18 0913)  Complications: No apparent anesthesia complications

## 2018-01-21 NOTE — Anesthesia Preprocedure Evaluation (Addendum)
Anesthesia Evaluation  Patient identified by MRN, date of birth, ID band Patient awake    Reviewed: Allergy & Precautions, NPO status , Patient's Chart, lab work & pertinent test results  History of Anesthesia Complications (+) PONV  Airway Mallampati: II  TM Distance: >3 FB Neck ROM: Full    Dental  (+) Dental Advisory Given, Teeth Intact   Pulmonary sleep apnea , Current Smoker,    breath sounds clear to auscultation       Cardiovascular Exercise Tolerance: Good negative cardio ROS   Rhythm:Regular Rate:Normal     Neuro/Psych  Headaches, Anxiety Depression Bipolar Disorder negative neurological ROS     GI/Hepatic Neg liver ROS, S/p gastric bypass   Endo/Other  Hypothyroidism Morbid obesity  Renal/GU negative Renal ROS     Musculoskeletal   Abdominal   Peds  Hematology negative hematology ROS (+)   Anesthesia Other Findings   Reproductive/Obstetrics                           Anesthesia Physical Anesthesia Plan  ASA: II  Anesthesia Plan: General   Post-op Pain Management:    Induction: Intravenous  PONV Risk Score and Plan: 4 or greater and Scopolamine patch - Pre-op, Midazolam, Dexamethasone, Ondansetron and Treatment may vary due to age or medical condition  Airway Management Planned: Oral ETT  Additional Equipment:   Intra-op Plan:   Post-operative Plan: Extubation in OR  Informed Consent: I have reviewed the patients History and Physical, chart, labs and discussed the procedure including the risks, benefits and alternatives for the proposed anesthesia with the patient or authorized representative who has indicated his/her understanding and acceptance.   Dental advisory given  Plan Discussed with: CRNA  Anesthesia Plan Comments:         Anesthesia Quick Evaluation

## 2018-01-21 NOTE — Anesthesia Procedure Notes (Signed)
Procedure Name: Intubation Date/Time: 01/21/2018 10:00 AM Performed by: Wanita Chamberlain, CRNA Pre-anesthesia Checklist: Patient identified, Timeout performed, Emergency Drugs available, Suction available and Patient being monitored Patient Re-evaluated:Patient Re-evaluated prior to induction Oxygen Delivery Method: Circle system utilized Preoxygenation: Pre-oxygenation with 100% oxygen Induction Type: IV induction Ventilation: Mask ventilation without difficulty Laryngoscope Size: Mac and 3 Grade View: Grade I Tube type: Oral Tube size: 7.0 mm Number of attempts: 1 Airway Equipment and Method: Stylet Placement Confirmation: breath sounds checked- equal and bilateral,  CO2 detector,  positive ETCO2 and ETT inserted through vocal cords under direct vision Secured at: 21 cm Tube secured with: Tape Dental Injury: Teeth and Oropharynx as per pre-operative assessment

## 2018-01-21 NOTE — Anesthesia Postprocedure Evaluation (Signed)
Anesthesia Post Note  Patient: Monica Jefferson  Procedure(s) Performed: LIGATION OF INTERNAL FISTULA TRACT (N/A Rectum)     Patient location during evaluation: PACU Anesthesia Type: General Level of consciousness: awake and alert Pain management: pain level controlled Vital Signs Assessment: post-procedure vital signs reviewed and stable Respiratory status: spontaneous breathing, nonlabored ventilation, respiratory function stable and patient connected to nasal cannula oxygen Cardiovascular status: blood pressure returned to baseline and stable Postop Assessment: no apparent nausea or vomiting Anesthetic complications: no    Last Vitals:  Vitals:   01/21/18 1130 01/21/18 1145  BP: (!) 130/103 127/89  Pulse: 81 76  Resp: (!) 21 13  Temp:    SpO2:  96%    Last Pain:  Vitals:   01/21/18 0814  TempSrc: Osvaldo Angstral                 Kennieth RadFitzgerald, Myangel Summons E

## 2018-01-22 ENCOUNTER — Encounter (HOSPITAL_BASED_OUTPATIENT_CLINIC_OR_DEPARTMENT_OTHER): Payer: Self-pay | Admitting: General Surgery

## 2018-02-01 ENCOUNTER — Other Ambulatory Visit: Payer: Self-pay | Admitting: Family Medicine

## 2018-02-01 DIAGNOSIS — G4700 Insomnia, unspecified: Secondary | ICD-10-CM

## 2018-02-01 NOTE — Telephone Encounter (Signed)
Last OV 06/10/17 Zolpidem last filled 10/27/17 #90 with 0

## 2018-04-03 ENCOUNTER — Other Ambulatory Visit: Payer: Self-pay | Admitting: Family Medicine

## 2018-04-05 NOTE — Telephone Encounter (Signed)
Pt last here in June 2018. Has been refilled by Dr. Beverely Lowabori. I refilled 30 day supply. Can f/u with Dr. Beverely Lowabori on further refills and f/u for this medication.

## 2018-04-05 NOTE — Telephone Encounter (Signed)
Last OV 05/14/2017 Fill Date: 01/07/2018

## 2018-04-07 ENCOUNTER — Other Ambulatory Visit: Payer: Self-pay | Admitting: Family Medicine

## 2018-04-08 ENCOUNTER — Other Ambulatory Visit: Payer: Self-pay | Admitting: General Practice

## 2018-04-08 MED ORDER — QUETIAPINE FUMARATE 50 MG PO TABS: 150.0000 mg | ORAL_TABLET | Freq: Every day | ORAL | 0 refills | Status: DC

## 2018-04-29 ENCOUNTER — Ambulatory Visit: Payer: BLUE CROSS/BLUE SHIELD | Admitting: Family Medicine

## 2018-04-29 ENCOUNTER — Other Ambulatory Visit: Payer: Self-pay | Admitting: Family Medicine

## 2018-04-29 ENCOUNTER — Other Ambulatory Visit: Payer: Self-pay | Admitting: General Practice

## 2018-04-29 ENCOUNTER — Encounter: Payer: Self-pay | Admitting: Family Medicine

## 2018-04-29 ENCOUNTER — Other Ambulatory Visit: Payer: Self-pay

## 2018-04-29 VITALS — BP 130/82 | HR 105 | Temp 98.0°F | Resp 17 | Ht 64.5 in | Wt 232.2 lb

## 2018-04-29 DIAGNOSIS — F3175 Bipolar disorder, in partial remission, most recent episode depressed: Secondary | ICD-10-CM

## 2018-04-29 DIAGNOSIS — Z6839 Body mass index (BMI) 39.0-39.9, adult: Secondary | ICD-10-CM

## 2018-04-29 DIAGNOSIS — H01139 Eczematous dermatitis of unspecified eye, unspecified eyelid: Secondary | ICD-10-CM | POA: Diagnosis not present

## 2018-04-29 DIAGNOSIS — G4700 Insomnia, unspecified: Secondary | ICD-10-CM

## 2018-04-29 DIAGNOSIS — E669 Obesity, unspecified: Secondary | ICD-10-CM | POA: Diagnosis not present

## 2018-04-29 LAB — BASIC METABOLIC PANEL
BUN: 9 mg/dL (ref 6–23)
CALCIUM: 9.2 mg/dL (ref 8.4–10.5)
CO2: 25 mEq/L (ref 19–32)
CREATININE: 0.72 mg/dL (ref 0.40–1.20)
Chloride: 104 mEq/L (ref 96–112)
GFR: 96.08 mL/min (ref >60.00)
GLUCOSE: 69 mg/dL — AB (ref 70–99)
Potassium: 4.2 mEq/L (ref 3.5–5.1)
Sodium: 136 mEq/L (ref 135–145)

## 2018-04-29 LAB — CBC WITH DIFFERENTIAL/PLATELET
BASOS ABS: 0.1 10*3/uL (ref 0.0–0.1)
Basophils Relative: 1 % (ref 0.0–3.0)
Eosinophils Absolute: 0.1 10*3/uL (ref 0.0–0.7)
Eosinophils Relative: 1.1 % (ref 0.0–5.0)
HEMATOCRIT: 37.8 % (ref 36.0–46.0)
Hemoglobin: 12.3 g/dL (ref 12.0–15.0)
LYMPHS PCT: 20.2 % (ref 12.0–46.0)
Lymphs Abs: 1.9 10*3/uL (ref 0.7–4.0)
MCHC: 32.4 g/dL (ref 30.0–36.0)
MCV: 89.4 fl (ref 78.0–100.0)
MONOS PCT: 6 % (ref 3.0–12.0)
Monocytes Absolute: 0.6 10*3/uL (ref 0.1–1.0)
NEUTROS PCT: 71.7 % (ref 43.0–77.0)
Neutro Abs: 6.7 10*3/uL (ref 1.4–7.7)
Platelets: 251 10*3/uL (ref 150.0–400.0)
RBC: 4.23 Mil/uL (ref 3.87–5.11)
RDW: 17.2 % — ABNORMAL HIGH (ref 11.5–15.5)
WBC: 9.3 10*3/uL (ref 4.0–10.5)

## 2018-04-29 LAB — HEPATIC FUNCTION PANEL
ALBUMIN: 4.2 g/dL (ref 3.5–5.2)
ALT: 44 U/L — ABNORMAL HIGH (ref 0–35)
AST: 49 U/L — ABNORMAL HIGH (ref 0–37)
Alkaline Phosphatase: 124 U/L — ABNORMAL HIGH (ref 39–117)
Bilirubin, Direct: 0.1 mg/dL (ref 0.0–0.3)
TOTAL PROTEIN: 7.1 g/dL (ref 6.0–8.3)
Total Bilirubin: 0.3 mg/dL (ref 0.2–1.2)

## 2018-04-29 LAB — LIPID PANEL
CHOLESTEROL: 211 mg/dL — AB (ref 0–200)
HDL: 51.8 mg/dL (ref >39.00)
LDL CALC: 140 mg/dL — AB (ref 0–99)
NonHDL: 159.47
TRIGLYCERIDES: 95 mg/dL (ref 0.0–149.0)
Total CHOL/HDL Ratio: 4
VLDL: 19 mg/dL (ref 0.0–40.0)

## 2018-04-29 LAB — TSH: TSH: 2.13 u[IU]/mL (ref 0.35–4.50)

## 2018-04-29 MED ORDER — ZOLPIDEM TARTRATE ER 12.5 MG PO TBCR: 12.5000 mg | EXTENDED_RELEASE_TABLET | Freq: Every evening | ORAL | 0 refills | Status: DC | PRN

## 2018-04-29 NOTE — Telephone Encounter (Signed)
Last OV 04/29/18 Zolpidem last filled 02/02/18 #90 with 0

## 2018-04-29 NOTE — Telephone Encounter (Signed)
Refill request for Ambien, last filled on 02/02/18 #90  LOV: 04/29/18 Dr. Beverely Low  [facility] on file

## 2018-04-29 NOTE — Assessment & Plan Note (Signed)
Ongoing issue.  Pt reports depression is manageable but continues to struggle with anxiety.  Would like to try CBD oil as Lexapro and Buspar did not work in the past and she did not feel well on these.  Discussed need for reputable supplier of CBD oil.  Also referred pt to [facility] for counseling.  Will continue to follow closely.

## 2018-04-29 NOTE — Telephone Encounter (Signed)
Copied from CRM [id number]. Topic: Quick Communication - Rx Refill/Question >> Apr 29, 2018 11:08 AM Alexander Bergeron B wrote: Medication: zolpidem (AMBIEN CR) 12.5 MG CR tablet [[id number]] Has the patient contacted their pharmacy? Yes.   (Agent: If no, request that the patient contact the pharmacy for the refill.) Preferred Pharmacy (with phone number [facility] street name): [facility] Agent: Please be advised that RX refills may take up to 3 business days. We ask that you follow-up with your pharmacy.

## 2018-04-29 NOTE — Assessment & Plan Note (Signed)
Ongoing issue.  Stressed need for healthy diet and regular exercise.  Will check labs today to risk stratify.  # given for Dr Cathey Endow for medically supervised weight loss.  Pt to call and schedule.  Will follow.

## 2018-04-29 NOTE — Progress Notes (Signed)
   Subjective:    Patient ID: Monica Jefferson, female    DOB: 22-Dec-1980, 37 y.o.   MRN: [id number]  HPI Bipolar- overall having more good days than bad days.  Pt has been very busy w/ building a new house, kids being sick, end of school year.  Pt feels Seroquel is still working.  Pt is not having 'worthless days'.  Pt feels she is able to handle life.  Continues to have high anxiety- Buspar was ineffective.  Pt is considering CBD oil.  Pt is in need of therapist- needs referral.  Obesity- ongoing issue for pt.  She is interested in trying to find a metabolic/genetic weight loss program.  Denies CP, SOB, HAs, visual changes, edema.  Eczema- pt is asking for Derm referral.  Eyelids are cracking, burning and she is very uncomfortable.   Review of Systems For ROS see HPI     Objective:   Physical Exam  Constitutional: She is oriented to person, place, and time. She appears well-developed and well-nourished. No distress.  obese  HENT:  Head: Normocephalic and atraumatic.  Eyes: Pupils are equal, round, and reactive to light. Conjunctivae and EOM are normal.  Neck: Normal range of motion. Neck supple. No thyromegaly present.  Cardiovascular: Normal rate, regular rhythm, normal heart sounds and intact distal pulses.  No murmur heard. Pulmonary/Chest: Effort normal and breath sounds normal. No respiratory distress.  Abdominal: Soft. She exhibits no distension. There is no tenderness.  Musculoskeletal: She exhibits no edema.  Lymphadenopathy:    She has no cervical adenopathy.  Neurological: She is alert and oriented to person, place, and time.  Skin: Skin is warm and dry.  Psychiatric: She has a normal mood and affect. Her behavior is normal.  Vitals reviewed.         Assessment & Plan:

## 2018-04-29 NOTE — Assessment & Plan Note (Signed)
New.  Pt is not having relief w/ OTC hydrocortisone cream.  Refer to derm.  Will follow.

## 2018-04-29 NOTE — Patient Instructions (Signed)
Schedule your complete physical in 6 months We'll notify you of your lab results and make any changes if needed I put in a referral for Colen Darling for counseling (at our [place] office) [facility] call you with your derm appt Try the CBD oil- it can't hurt! Call Dr Ovidio Kin office and ask about the Medically Supervised Weight Loss [phone]  Continue to work on healthy diet and regular exercise- this will also improve stress Call with any questions or concerns Have a great summer!!!

## 2018-05-03 ENCOUNTER — Encounter: Payer: Self-pay | Admitting: General Practice

## 2018-06-28 ENCOUNTER — Ambulatory Visit: Payer: Self-pay | Admitting: Psychology

## 2018-07-05 ENCOUNTER — Other Ambulatory Visit: Payer: Self-pay | Admitting: Family Medicine

## 2018-07-26 ENCOUNTER — Other Ambulatory Visit: Payer: Self-pay | Admitting: Family Medicine

## 2018-07-26 DIAGNOSIS — G4700 Insomnia, unspecified: Secondary | ICD-10-CM

## 2018-07-26 NOTE — Telephone Encounter (Signed)
Last OV 04/29/18 Zolpidem last filled 04/29/18 #90 with 0

## 2018-08-03 ENCOUNTER — Encounter: Payer: Self-pay | Admitting: Physician Assistant

## 2018-08-03 ENCOUNTER — Ambulatory Visit (INDEPENDENT_AMBULATORY_CARE_PROVIDER_SITE_OTHER): Payer: BLUE CROSS/BLUE SHIELD | Admitting: Physician Assistant

## 2018-08-03 ENCOUNTER — Other Ambulatory Visit: Payer: Self-pay

## 2018-08-03 VITALS — BP 120/86 | HR 84 | Temp 98.1°F | Resp 16 | Ht 64.5 in | Wt 229.6 lb

## 2018-08-03 DIAGNOSIS — R8290 Unspecified abnormal findings in urine: Secondary | ICD-10-CM | POA: Diagnosis not present

## 2018-08-03 DIAGNOSIS — N898 Other specified noninflammatory disorders of vagina: Secondary | ICD-10-CM

## 2018-08-03 LAB — POCT URINALYSIS DIPSTICK
Bilirubin, UA: NEGATIVE
Glucose, UA: NEGATIVE
KETONES UA: NEGATIVE
NITRITE UA: NEGATIVE
PROTEIN UA: POSITIVE — AB
Urobilinogen, UA: 0.2 E.U./dL
pH, UA: 5 (ref 5.0–8.0)

## 2018-08-03 MED ORDER — FLUCONAZOLE 150 MG PO TABS: 150.0000 mg | ORAL_TABLET | Freq: Once | ORAL | 0 refills | Status: AC

## 2018-08-03 NOTE — Progress Notes (Signed)
Patient presents to clinic today c/o 2 days of significant vaginal itching and burning. Is currently on amoxicillin after a recent dental procedure. Denies history of yeast vaginitis. Denies concern for STI.   Past Medical History:  Diagnosis Date  . Allergic rhinitis   . Anal fistula   . Anxiety   . B12 deficiency   . Bipolar disorder in partial remission (HCC)    FOLLOWED BY PCP  . Depression   . Eczema   . Frequent headaches   . History of hypothyroidism    per pt hx yrs ago  . Iron deficiency anemia   . OSA (obstructive sleep apnea)    per pt dx  prior to gastric bypass in 2008 wore cpap for awhile but has not used in yrs  . PONV (postoperative nausea and vomiting)    severe    Current Outpatient Medications on File Prior to Visit  Medication Sig Dispense Refill  . amoxicillin (AMOXIL) 500 MG tablet TAKE 1 TABLET BY MOUTH 3 TIMES A DAY FOR 7 DAYS  0  . hydrocortisone 2.5 % cream Apply 1 application topically as needed.     Marland Kitchen. ibuprofen (ADVIL,MOTRIN) 800 MG tablet Take 800 mg by mouth every 6 (six) hours as needed for headache or moderate pain.    Marland Kitchen. levocetirizine (XYZAL) 5 MG tablet every evening.  0  . QUEtiapine (SEROQUEL) 50 MG tablet TAKE 3 TABLETS (150 MG TOTAL) BY MOUTH AT BEDTIME. 270 tablet 0  . zolpidem (AMBIEN CR) 12.5 MG CR tablet TAKE 1 TABLET (12.5 MG TOTAL) BY MOUTH AT BEDTIME AS NEEDED. FOR SLEEP 90 tablet 0   No current facility-administered medications on file prior to visit.     No Known Allergies  Family History  Problem Relation Age of Onset  . Mental illness Mother   . Alcohol abuse Father   . Mental illness Father   . Mental illness Maternal Aunt   . Mental illness Maternal Uncle   . Alcohol abuse Maternal Uncle   . Mental illness Maternal Grandmother     Social History   Socioeconomic History  . Marital status: Married    Spouse name: Not on file  . Number of children: Not on file  . Years of education: Not on file  . Highest  education level: Not on file  Occupational History  . Not on file  Social Needs  . Financial resource strain: Not on file  . Food insecurity:    Worry: Not on file    Inability: Not on file  . Transportation needs:    Medical: Not on file    Non-medical: Not on file  Tobacco Use  . Smoking status: Current Every Day Smoker    Packs/day: 1.00    Years: 14.00    Pack years: 14.00    Types: Cigarettes  . Smokeless tobacco: Never Used  Substance and Sexual Activity  . Alcohol use: Yes    Comment: OCCASIONAL  . Drug use: No  . Sexual activity: Yes    Birth control/protection: IUD    Comment: Mirena IUD placed approx. 2016  Lifestyle  . Physical activity:    Days per week: Not on file    Minutes per session: Not on file  . Stress: Not on file  Relationships  . Social connections:    Talks on phone: Not on file    Gets together: Not on file    Attends religious service: Not on file    Active member  of club or organization: Not on file    Attends meetings of clubs or organizations: Not on file    Relationship status: Not on file  Other Topics Concern  . Not on file  Social History Narrative  . Not on file   Review of Systems - See HPI.  All other ROS are negative.  BP 120/86   Pulse 84   Temp 98.1 F (36.7 C) (Oral)   Resp 16   Ht 5' 4.5" (1.638 m)   Wt 229 lb 9.6 oz (104.1 kg)   SpO2 98%   BMI 38.80 kg/m   Physical Exam  Constitutional: She appears well-developed and well-nourished.  HENT:  Head: Normocephalic and atraumatic.  Cardiovascular: Normal rate, regular rhythm, normal heart sounds and intact distal pulses.  Pulmonary/Chest: Effort normal.  Abdominal: Soft. Bowel sounds are normal. She exhibits no distension. There is no tenderness. There is no guarding.  Psychiatric: She has a normal mood and affect.  Vitals reviewed.  Assessment/Plan: 1. Vaginal pruritus Most likely yeast giving current antibiotic use. Start Diflucan. Supportive measures  reviewed. UA today with trace LE will send for culture. - POCT urinalysis dipstick - fluconazole (DIFLUCAN) 150 MG tablet; Take 1 tablet (150 mg total) by mouth once for 1 dose. May repeat course in 3 days.  Dispense: 2 tablet; Refill: 0  2. Abnormal urinalysis Trace LE. Will send for culture giving burning sensation. Will treat based on culture results. - Urine Culture    Piedad ClimesWilliam Cody Kerilyn Cortner, PA-C

## 2018-08-03 NOTE — Patient Instructions (Signed)
Take the Diflucan as directed. Start a daily probiotic while finishing your Amoxicillin. Follow-up with me if symptoms are not resolving or if new symptoms develop.   Vaginal Yeast infection, Adult Vaginal yeast infection is a condition that causes soreness, swelling, and redness (inflammation) of the vagina. It also causes vaginal discharge. This is a common condition. Some women get this infection frequently. What are the causes? This condition is caused by a change in the normal balance of the yeast (candida) and bacteria that live in the vagina. This change causes an overgrowth of yeast, which causes the inflammation. What increases the risk? This condition is more likely to develop in:  Women who take antibiotic medicines.  Women who have diabetes.  Women who take birth control pills.  Women who are pregnant.  Women who douche often.  Women who have a weak defense (immune) system.  Women who have been taking steroid medicines for a long time.  Women who frequently wear tight clothing.  What are the signs or symptoms? Symptoms of this condition include:  White, thick vaginal discharge.  Swelling, itching, redness, and irritation of the vagina. The lips of the vagina (vulva) may be affected as well.  Pain or a burning feeling while urinating.  Pain during sex.  How is this diagnosed? This condition is diagnosed with a medical history and physical exam. This will include a pelvic exam. Your health care provider will examine a sample of your vaginal discharge under a microscope. Your health care provider may send this sample for testing to confirm the diagnosis. How is this treated? This condition is treated with medicine. Medicines may be over-the-counter or prescription. You may be told to use one or more of the following:  Medicine that is taken orally.  Medicine that is applied as a cream.  Medicine that is inserted directly into the vagina  (suppository).  Follow these instructions at home:  Take or apply over-the-counter and prescription medicines only as told by your health care provider.  Do not have sex until your health care provider has approved. Tell your sex partner that you have a yeast infection. That person should go to his or her health care provider if he or she develops symptoms.  Do not wear tight clothes, such as pantyhose or tight pants.  Avoid using tampons until your health care provider approves.  Eat more yogurt. This may help to keep your yeast infection from returning.  Try taking a sitz bath to help with discomfort. This is a warm water bath that is taken while you are sitting down. The water should only come up to your hips and should cover your buttocks. Do this 3-4 times per day or as told by your health care provider.  Do not douche.  Wear breathable, cotton underwear.  If you have diabetes, keep your blood sugar levels under control. Contact a health care provider if:  You have a fever.  Your symptoms go away and then return.  Your symptoms do not get better with treatment.  Your symptoms get worse.  You have new symptoms.  You develop blisters in or around your vagina.  You have blood coming from your vagina and it is not your menstrual period.  You develop pain in your abdomen. This information is not intended to replace advice given to you by your health care provider. Make sure you discuss any questions you have with your health care provider. Document Released: 09/10/2005 Document Revised: 05/14/2016 Document Reviewed: 06/04/2015 Elsevier  Interactive Patient Education  [employer].

## 2018-08-04 LAB — URINE CULTURE
MICRO NUMBER:: 90991213
Result:: NO GROWTH
SPECIMEN QUALITY: ADEQUATE

## 2018-09-14 ENCOUNTER — Other Ambulatory Visit: Payer: Self-pay

## 2018-09-14 ENCOUNTER — Ambulatory Visit: Payer: BLUE CROSS/BLUE SHIELD | Admitting: Family Medicine

## 2018-09-14 ENCOUNTER — Encounter: Payer: Self-pay | Admitting: Family Medicine

## 2018-09-14 VITALS — BP 121/81 | HR 71 | Temp 98.3°F | Resp 16 | Ht 65.0 in | Wt 231.5 lb

## 2018-09-14 DIAGNOSIS — G43009 Migraine without aura, not intractable, without status migrainosus: Secondary | ICD-10-CM | POA: Diagnosis not present

## 2018-09-14 DIAGNOSIS — G43909 Migraine, unspecified, not intractable, without status migrainosus: Secondary | ICD-10-CM | POA: Insufficient documentation

## 2018-09-14 DIAGNOSIS — J302 Other seasonal allergic rhinitis: Secondary | ICD-10-CM

## 2018-09-14 MED ORDER — PREDNISONE 10 MG PO TABS: ORAL_TABLET | ORAL | 0 refills | Status: DC

## 2018-09-14 MED ORDER — SUMATRIPTAN SUCCINATE 50 MG PO TABS: 50.0000 mg | ORAL_TABLET | Freq: Once | ORAL | 6 refills | Status: DC

## 2018-09-14 NOTE — Patient Instructions (Signed)
Follow up as needed or as scheduled START the Prednisone as directed- 3 tabs at the same time x3 days, then 2 tabs x3 days, then 1 tab.  Take w/ food RESTART your Xyzal daily ADD the Dymista nasal spray Drink plenty of fluids If headache progresses to full blown migraine- take the Imitrex Call with any questions or concerns Hang in there!

## 2018-09-14 NOTE — Assessment & Plan Note (Signed)
Deteriorated.  Encouraged her to restart Xyzal and start the Dymista sample she was given at the allergist.  Suspect the sinus inflammation is contributing to her migraines.  Will start low dose pred taper to improve both headache and inflammation.  Cautioned her to watch for mania given her dx of bipolar. Pt expressed understanding and is in agreement w/ plan.

## 2018-09-14 NOTE — Assessment & Plan Note (Signed)
New to provider, recurrent issue for pt.  Will start Prednisone taper to break migraine cycle and provide Imitrex to use PRN.  Reviewed supportive care and red flags that should prompt return.  Pt expressed understanding and is in agreement w/ plan.

## 2018-09-14 NOTE — Progress Notes (Signed)
   Subjective:    Patient ID: Monica Jefferson, female    DOB: [birth year], 37 y.o.   MRN: [id number]  HPI HA- 'i've just been having crazy headaches'.  sxs for last 10 days.  + nausea, photophobia.  Tends to start locally and then generalizes.  + hx of migraines- previously on Imitrex.  HA was severe on Friday and felt similar to previous migraines.  Yesterday started to get bad but she was able to stop this w/ advil and tylenol combo.  Pt also has seasonal allergies which have been severe.  Taking benadryl as needed   Review of Systems For ROS see HPI     Objective:   Physical Exam  Constitutional: She appears well-developed and well-nourished. No distress.  HENT:  Head: Normocephalic and atraumatic.  Right Ear: Tympanic membrane is retracted.  Left Ear: Tympanic membrane is retracted.  Nose: Mucosal edema and rhinorrhea present. Right sinus exhibits no maxillary sinus tenderness and no frontal sinus tenderness. Left sinus exhibits no maxillary sinus tenderness and no frontal sinus tenderness.  Mouth/Throat: Mucous membranes are normal. Posterior oropharyngeal erythema (w/ PND) present.  Eyes: Pupils are equal, round, and reactive to light. Conjunctivae and EOM are normal.  Neck: Normal range of motion. Neck supple.  Cardiovascular: Normal rate, regular rhythm and normal heart sounds.  Pulmonary/Chest: Effort normal and breath sounds normal. No respiratory distress. She has no wheezes. She has no rales.  Lymphadenopathy:    She has no cervical adenopathy.  Vitals reviewed.         Assessment & Plan:

## 2018-10-02 ENCOUNTER — Other Ambulatory Visit: Payer: Self-pay | Admitting: Family Medicine

## 2018-10-05 ENCOUNTER — Telehealth: Payer: Self-pay | Admitting: Family Medicine

## 2018-10-05 NOTE — Telephone Encounter (Signed)
Needs follow up in office to assess for further management options for uncontrolled migraines.  Please schedule f/u visit with PCP>

## 2018-10-05 NOTE — Telephone Encounter (Signed)
Copied from CRM [id number]. Topic: Quick Communication - See Telephone Encounter >> Oct 05, 2018 12:22 PM Arlyss Gandy, NT wrote: CRM for notification. See Telephone encounter for: 10/05/18. Pt states that the imitrex does not seem to be helping her headaches and she would like to see if something else can be called in to help her? [id number] - [place], [place] - [address] [phone] (Phone) [phone] (Fax)

## 2018-10-06 NOTE — Telephone Encounter (Signed)
Spoke with patient to try to schedule an appointment.   Advised patient that Dr. Beverely Low was out of the office however Dr. Mardelle Matte suggested an office visit for changing medications.   Patient refused appointment and stated that she wants her PCP's opinion. Advised that an appointment might be needed to document medication changes and determine if anything else is needed and that I could get her scheduled for Friday when Dr. Beverely Low is back. She asked that I just leave a note for her PCP.

## 2018-10-08 MED ORDER — RIZATRIPTAN BENZOATE 10 MG PO TABS: 10.0000 mg | ORAL_TABLET | ORAL | 6 refills | Status: DC | PRN

## 2018-10-08 NOTE — Telephone Encounter (Signed)
Medication filled to pharmacy as requested.  Pt made aware.  

## 2018-10-08 NOTE — Telephone Encounter (Signed)
Ok to switch to Maxalt 10mg  x1 dose, may repeat in 2 hrs if needed.  #10, 6 refills (instead of Imitrex)

## 2018-10-22 ENCOUNTER — Other Ambulatory Visit: Payer: Self-pay | Admitting: General Practice

## 2018-10-22 DIAGNOSIS — G4700 Insomnia, unspecified: Secondary | ICD-10-CM

## 2018-10-22 NOTE — Telephone Encounter (Signed)
Last OV 09/14/18 Zolpidem last filled 07/26/18 #90 with 0

## 2018-10-25 MED ORDER — ZOLPIDEM TARTRATE ER 12.5 MG PO TBCR: 12.5000 mg | EXTENDED_RELEASE_TABLET | Freq: Every evening | ORAL | 0 refills | Status: DC | PRN

## 2018-12-30 ENCOUNTER — Other Ambulatory Visit: Payer: Self-pay | Admitting: Family Medicine

## 2019-01-20 ENCOUNTER — Other Ambulatory Visit: Payer: Self-pay | Admitting: Family Medicine

## 2019-01-20 DIAGNOSIS — G4700 Insomnia, unspecified: Secondary | ICD-10-CM

## 2019-01-20 NOTE — Telephone Encounter (Signed)
Last refill:10/25/18 #90, 0 Last OV: 09/14/18

## 2019-02-16 ENCOUNTER — Ambulatory Visit: Payer: Self-pay | Admitting: *Deleted

## 2019-02-16 MED ORDER — OSELTAMIVIR PHOSPHATE 75 MG PO CAPS: 75.0000 mg | ORAL_CAPSULE | Freq: Every day | ORAL | 0 refills | Status: AC

## 2019-02-16 NOTE — Telephone Encounter (Signed)
Patient's kids has the flu so she has been exposed. She wants to know what she can do for preventative measures.  Call to patient reviewed preventive measures- hand washing, cleaning surfaces touched, isolation from others family members. Patient is primary care giver- but only has her allergy symptoms at this time- she will call if she develops fever, body aches, headache and other symptoms. Advised would send call for review to provider for review and treatment if appropriate at this time.  Reason for Disposition . [1] Influenza EXPOSURE within last 48 hours (2 days) AND [2] NOT HIGH RISK AND [3] strongly requests antiviral medication  Answer Assessment - Initial Assessment Questions 1. TYPE of EXPOSURE: "How were you exposed?" (e.g., close contact, not a close contact)     Both children have been diagnosed with flu 2. DATE of EXPOSURE: "When did the exposure occur?" (e.g., hour, days, weeks)     1st- Monday, 2nd -today  Both type B 3. PREGNANCY: "Is there any chance you are pregnant?" "When was your last menstrual period?"     No- LMP- patient uses IUD 4. HIGH RISK for COMPLICATIONS: "Do you have any heart or lung problems? Do you have a weakened immune system?" (e.g., CHF, COPD, asthma, HIV positive, chemotherapy, renal failure, diabetes mellitus, sickle cell anemia)     no 5. SYMPTOMS: "Do you have any symptoms?" (e.g., cough, fever, sore throat, difficulty breathing).     Cough, sneezing- patient has seasonal allergy  Protocols used: INFLUENZA EXPOSURE-A-AH

## 2019-02-16 NOTE — Telephone Encounter (Signed)
Spoke with patient. Aware that we can call in medication.  Agreed to plan.  Medication has been sent to the pharmacy.

## 2019-02-16 NOTE — Addendum Note (Signed)
Addended by: Lenis Dickinson on: 02/16/2019 04:28 PM   Modules accepted: Orders

## 2019-02-16 NOTE — Telephone Encounter (Signed)
Ok for Tamiflu 75mg  daily x10 days as preventative.  Keep coughs covered and wash hands!!

## 2019-03-29 ENCOUNTER — Other Ambulatory Visit: Payer: Self-pay | Admitting: Family Medicine

## 2019-04-17 ENCOUNTER — Other Ambulatory Visit: Payer: Self-pay | Admitting: Family Medicine

## 2019-04-17 DIAGNOSIS — G4700 Insomnia, unspecified: Secondary | ICD-10-CM

## 2019-04-18 NOTE — Telephone Encounter (Signed)
Last refill:01/20/19 #90, 0 Last OV: 09/14/18

## 2019-04-20 ENCOUNTER — Encounter: Payer: Self-pay | Admitting: Physician Assistant

## 2019-04-20 ENCOUNTER — Ambulatory Visit (INDEPENDENT_AMBULATORY_CARE_PROVIDER_SITE_OTHER): Payer: BLUE CROSS/BLUE SHIELD | Admitting: Physician Assistant

## 2019-04-20 ENCOUNTER — Other Ambulatory Visit: Payer: Self-pay

## 2019-04-20 VITALS — HR 102 | Temp 98.4°F | Ht 65.0 in | Wt 240.0 lb

## 2019-04-20 DIAGNOSIS — H8110 Benign paroxysmal vertigo, unspecified ear: Secondary | ICD-10-CM

## 2019-04-20 MED ORDER — MECLIZINE HCL 25 MG PO TABS: 25.0000 mg | ORAL_TABLET | Freq: Three times a day (TID) | ORAL | 0 refills | Status: DC | PRN

## 2019-04-20 MED ORDER — FLUTICASONE PROPIONATE 50 MCG/ACT NA SUSP: 2.0000 | Freq: Every day | NASAL | 0 refills | Status: DC

## 2019-04-20 NOTE — Patient Instructions (Signed)
Instructions sent to MyChart.   Keep well-hydrated and try to get plenty of rest. Take the meclizine as directed if needed for dizziness. Start the Flonase once daily. Consider stopping the Xyzal and start a daily Claritin. Follow-up if symptoms are not easing up over the next few days. Call or come to office if he noted any new symptoms. ER for any severe symptoms.  Hang in there!  How to Perform the Epley Maneuver The Epley maneuver is an exercise that relieves symptoms of vertigo. Vertigo is the feeling that you or your surroundings are moving when they are not. When you feel vertigo, you may feel like the room is spinning and have trouble walking. Dizziness is a little different than vertigo. When you are dizzy, you may feel unsteady or light-headed. You can do this maneuver at home whenever you have symptoms of vertigo. You can do it up to 3 times a day until your symptoms go away. Even though the Epley maneuver may relieve your vertigo for a few weeks, it is possible that your symptoms will return. This maneuver relieves vertigo, but it does not relieve dizziness. What are the risks? If it is done correctly, the Epley maneuver is considered safe. Sometimes it can lead to dizziness or nausea that goes away after a short time. If you develop other symptoms, such as changes in vision, weakness, or numbness, stop doing the maneuver and call your health care provider. How to perform the Epley maneuver 1. Sit on the edge of a bed or table with your back straight and your legs extended or hanging over the edge of the bed or table. 2. Turn your head halfway toward the affected ear or side. 3. Lie backward quickly with your head turned until you are lying flat on your back. You may want to position a pillow under your shoulders. 4. Hold this position for 30 seconds. You may experience an attack of vertigo. This is normal. 5. Turn your head to the opposite direction until your unaffected ear is  facing the floor. 6. Hold this position for 30 seconds. You may experience an attack of vertigo. This is normal. Hold this position until the vertigo stops. 7. Turn your whole body to the same side as your head. Hold for another 30 seconds. 8. Sit back up. You can repeat this exercise up to 3 times a day. Follow these instructions at home:  After doing the Epley maneuver, you can return to your normal activities.  Ask your health care provider if there is anything you should do at home to prevent vertigo. He or she may recommend that you: ? Keep your head raised (elevated) with two or more pillows while you sleep. ? Do not sleep on the side of your affected ear. ? Get up slowly from bed. ? Avoid sudden movements during the day. ? Avoid extreme head movement, like looking up or bending over. Contact a health care provider if:  Your vertigo gets worse.  You have other symptoms, including: ? Nausea. ? Vomiting. ? Headache. Get help right away if:  You have vision changes.  You have a severe or worsening headache or neck pain.  You cannot stop vomiting.  You have new numbness or weakness in any part of your body. Summary  Vertigo is the feeling that you or your surroundings are moving when they are not.  The Epley maneuver is an exercise that relieves symptoms of vertigo.  If the Epley maneuver is done correctly,  it is considered safe. You can do it up to 3 times a day. This information is not intended to replace advice given to you by your health care provider. Make sure you discuss any questions you have with your health care provider. Document Released: 12/06/2013 Document Revised: 10/21/2016 Document Reviewed: 10/21/2016 Elsevier Interactive Patient Education  2019 [employer]
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
## 2019-04-20 NOTE — Progress Notes (Signed)
I have discussed the procedure for the virtual visit with the patient who has given consent to proceed with assessment and treatment.   Monica Jefferson, CMA     

## 2019-04-22 ENCOUNTER — Other Ambulatory Visit: Payer: Self-pay | Admitting: Physician Assistant

## 2019-04-22 ENCOUNTER — Ambulatory Visit: Payer: Self-pay | Admitting: *Deleted

## 2019-04-22 MED ORDER — DIAZEPAM 2 MG PO TABS: 2.0000 mg | ORAL_TABLET | Freq: Three times a day (TID) | ORAL | 0 refills | Status: DC | PRN

## 2019-04-22 NOTE — Telephone Encounter (Signed)
Advised patient to continue the Flonase nasal spray and stop the Meclizine and start Diazepam in the place. She is agreeable.

## 2019-04-22 NOTE — Telephone Encounter (Signed)
Make sure she is taking the Flonase to help with any Eustachian tube dysfunction contributing to symptoms.  Stop the Meclizine. I will send in Rx for Diazepam to help with dizziness. If not improving over weekend with medicine, she will need in-person evaluation.

## 2019-04-22 NOTE — Telephone Encounter (Signed)
Updated symptoms

## 2019-04-22 NOTE — Telephone Encounter (Signed)
Pt saw C. Daphine Deutscher, [facility] yesterday for vertigo. Calling today to report symptoms "The same, not worse but meclizine not helping." States took med 3xs yesterday and twice this AM. Reports remains dizzy, nauseated, no vomiting. Pt calling to request additional med if appropriate. Also reports has been attempting Epley Maneuver, "Difficult to do, I'm probably not doing it correctly." States is staying hydrated.  Please advise: CB# [phone]  Reason for Disposition . Caller has NON-URGENT medication question about med that PCP prescribed and triager unable to answer question  Protocols used: MEDICATION QUESTION CALL-A-AH

## 2019-05-13 ENCOUNTER — Other Ambulatory Visit: Payer: Self-pay | Admitting: Physician Assistant

## 2019-05-13 DIAGNOSIS — H8110 Benign paroxysmal vertigo, unspecified ear: Secondary | ICD-10-CM

## 2019-06-28 ENCOUNTER — Other Ambulatory Visit: Payer: Self-pay | Admitting: Family Medicine

## 2019-07-14 ENCOUNTER — Other Ambulatory Visit: Payer: Self-pay | Admitting: Family Medicine

## 2019-07-14 ENCOUNTER — Telehealth: Payer: Self-pay | Admitting: Family Medicine

## 2019-07-14 DIAGNOSIS — G4700 Insomnia, unspecified: Secondary | ICD-10-CM

## 2019-07-14 NOTE — Telephone Encounter (Signed)
Last OV 04/20/19 (vertigo-[place]) Zolpidem last filled 04/18/19 #90 with 0

## 2019-07-14 NOTE — Telephone Encounter (Signed)
Medication: zolpidem (AMBIEN CR) 12.5 MG CR tablet [[id number]]   Has the patient contacted their pharmacy? Yes  (Agent: If no, request that the patient contact the pharmacy for the refill.) (Agent: If yes, when and what did the pharmacy advise?)  Preferred Pharmacy (with phone number or street name): [id number] - [place], [place] - [phone] (Phone) [phone] (Fax)    Agent: Please be advised that RX refills may take up to 3 business days. We ask that you follow-up with your pharmacy.

## 2019-07-14 NOTE — Telephone Encounter (Signed)
Rx request was sent to PCP this morning. Still has not been approved. Closing this encounter and will contact pt once med has been filled.

## 2019-07-14 NOTE — Telephone Encounter (Signed)
Please advise on refill.

## 2019-07-15 NOTE — Telephone Encounter (Signed)
Pt called to check status 3:35, informed of med refill protocol. Expressed understanding

## 2019-07-20 ENCOUNTER — Telehealth: Payer: Self-pay | Admitting: Family Medicine

## 2019-07-20 NOTE — Telephone Encounter (Signed)
Please advise 
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
## 2019-07-21 ENCOUNTER — Other Ambulatory Visit: Payer: Self-pay

## 2019-07-21 MED ORDER — SCOPOLAMINE 1 MG/3DAYS TD PT72: 1.0000 | MEDICATED_PATCH | TRANSDERMAL | 0 refills | Status: DC

## 2019-07-21 NOTE — Telephone Encounter (Signed)
Spoke with patient, agreeable to Scopolamine patches. Rx sent to [facility] [place].

## 2019-07-21 NOTE — Telephone Encounter (Signed)
Reviewing in PCP absence. There are several over the counter options like Bromine, Dramamine and some acupressure wrist bands that you can find at any pharmacy. Can also send in Rx Scopolamine patch if she would like.

## 2019-09-30 ENCOUNTER — Other Ambulatory Visit: Payer: Self-pay | Admitting: Family Medicine

## 2019-10-10 ENCOUNTER — Other Ambulatory Visit: Payer: Self-pay | Admitting: Family Medicine

## 2019-10-11 ENCOUNTER — Other Ambulatory Visit: Payer: Self-pay | Admitting: Family Medicine

## 2019-10-11 DIAGNOSIS — G4700 Insomnia, unspecified: Secondary | ICD-10-CM

## 2019-10-11 NOTE — Telephone Encounter (Signed)
Last OV 04/20/19 Zolpidem last filled 07/15/19 #90 with 0

## 2019-10-12 ENCOUNTER — Other Ambulatory Visit: Payer: Self-pay

## 2019-10-12 ENCOUNTER — Encounter: Payer: Self-pay | Admitting: Family Medicine

## 2019-10-12 ENCOUNTER — Ambulatory Visit (INDEPENDENT_AMBULATORY_CARE_PROVIDER_SITE_OTHER): Payer: BC Managed Care – PPO | Admitting: Family Medicine

## 2019-10-12 DIAGNOSIS — G43009 Migraine without aura, not intractable, without status migrainosus: Secondary | ICD-10-CM | POA: Diagnosis not present

## 2019-10-12 DIAGNOSIS — G4700 Insomnia, unspecified: Secondary | ICD-10-CM | POA: Diagnosis not present

## 2019-10-12 DIAGNOSIS — F3175 Bipolar disorder, in partial remission, most recent episode depressed: Secondary | ICD-10-CM

## 2019-10-12 NOTE — Progress Notes (Signed)
I have discussed the procedure for the virtual visit with the patient who has given consent to proceed with assessment and treatment.   Monica Jefferson, CMA     

## 2019-10-12 NOTE — Progress Notes (Signed)
Virtual Visit via Video   I connected with patient on 10/12/19 at  4:00 PM EDT by a video enabled telemedicine application and verified that I am speaking with the correct person using two identifiers.  Location patient: Home Location provider: Acup[facility]urist, Office Persons participating in the virtual visit: Patient, Provider, [place] (Jess B)  I discussed the limitations of evaluation and management by telemedicine and the availability of in person appointments. The patient expressed understanding and agreed to proceed.  Interactive audio and video telecommunications were attempted between this provider and patient, however failed, due to patient having technical difficulties OR patient did not have access to video capability.  We continued and completed visit with audio only.   Subjective:   HPI:   Bipolar- pt reports mood is 'pretty good considering'.  She feels they are making the best of the COVID situation.  Kids are doing well, which helps the situation.  Currently feels the Seroquel dose (150mg  QHS) is appropriate for the time being.  Denies tearfulness.  Continues to have trouble sleeping.  Denies irritability, anger.  Insomnia- chronic problem.  On Ambien CR 12.5mg  but continues to have difficulty.  Able to fall asleep but having issues staying asleep.  Migraines- increased frequency lately.  Pt reports ongoing HAs for the last month.  Maxalt will make her 'functional' but doesn't resolve HAs.    ROS:   See pertinent positives and negatives per HPI.  Patient Active Problem List   Diagnosis Date Noted  . Migraine 09/14/2018  . 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 Seasonal allergies 05/08/2017  . IUD (intrauterine device) in place 05/08/2017  . Hypothyroidism 09/03/2016  . Anxiety and depression 09/03/2016  . Insomnia 09/03/2016  . Bipolar disorder in partial remission ([place]) 09/03/2016  . Hx of bariatric surgery 09/03/2016  . B12 deficiency 09/03/2016  . Anemia, iron  deficiency 09/03/2016  . Body mass index (BMI) of 39.0 to 39.9 in adult 09/03/2016    Social History   Tobacco Use  . Smoking status: Current Every Day Smoker    Packs/day: 1.00    Years: 14.00    Pack years: 14.00    Types: Cigarettes  . Smokeless tobacco: Never Used  Substance Use Topics  . Alcohol use: Yes    Comment: OCCASIONAL    Current Outpatient Medications:  .  diazepam (VALIUM) 2 MG tablet, Take 1 tablet (2 mg total) by mouth every 8 (eight) hours as needed (vertigo)., Disp: 15 tablet, Rfl: 0 .  fluticasone (FLONASE) 50 MCG/ACT nasal spray, SPRAY 2 SPRAYS INTO EACH NOSTRIL EVERY DAY, Disp: 16 g, Rfl: 0 .  QUEtiapine (SEROQUEL) 50 MG tablet, TAKE 3 TABLETS BY MOUTH EVERY DAY AT BEDTIME, Disp: 270 tablet, Rfl: 0 .  rizatriptan (MAXALT) 10 MG tablet, TAKE 1 TABLET BY MOUTH AS NEEDED FOR MIGRAINE. MAY REPEAT IN 2 HOURS IF NEEDED, Disp: 8 tablet, Rfl: 8 .  scopolamine (TRANSDERM-SCOP, 1.5 MG,) 1 MG/3DAYS, Place 1 patch (1.5 mg total) onto the skin every 3 (three) days., Disp: 10 patch, Rfl: 0 .  tacrolimus (PROTOPIC) 0.1 % ointment, Apply as directed, Disp: , Rfl:  .  triamcinolone cream (KENALOG) 0.1 %, Apply as directed, Disp: , Rfl:  .  zolpidem (AMBIEN CR) 12.5 MG CR tablet, TAKE 1 TABLET BY MOUTH AT BEDTIME AS NEEDED FOR SLEEP, Disp: 90 tablet, Rfl: 0  No Known Allergies  Objective:   There were no vitals taken for this visit. Pt is able to speak clearly, coherently without  shortness of breath or increased work of breathing.  Thought process is linear.  Mood is appropriate.    Assessment and Plan:   Bipolar- chronic problem.  Pt feels sxs are currently stable despite COVID stresses.  Not interested in switching Seroquel dose at this time.  Will continue to follow.  Insomnia- ongoing issue.  On Ambien CR.  Able to fall asleep but still having difficulty staying asleep.  Will not add or change medications at this time.  Migraines- worsening in frequency.  Wants to  know if she's a candidate for Botox.  Referral to neuro placed.   Neena Rhymes, MD 10/12/2019

## 2019-10-19 ENCOUNTER — Encounter: Payer: Self-pay | Admitting: Neurology

## 2019-11-23 ENCOUNTER — Encounter: Payer: Self-pay | Admitting: Family Medicine

## 2019-11-23 ENCOUNTER — Other Ambulatory Visit: Payer: Self-pay

## 2019-11-23 ENCOUNTER — Ambulatory Visit (INDEPENDENT_AMBULATORY_CARE_PROVIDER_SITE_OTHER): Payer: BC Managed Care – PPO | Admitting: Family Medicine

## 2019-11-23 DIAGNOSIS — Z720 Tobacco use: Secondary | ICD-10-CM

## 2019-11-23 MED ORDER — VARENICLINE TARTRATE 1 MG PO TABS: 1.0000 mg | ORAL_TABLET | Freq: Two times a day (BID) | ORAL | 3 refills | Status: DC

## 2019-11-23 MED ORDER — CHANTIX STARTING MONTH PAK 0.5 MG X 11 & 1 MG X 42 PO TABS: ORAL_TABLET | ORAL | 0 refills | Status: DC

## 2019-11-23 NOTE — Progress Notes (Signed)
Virtual Visit via Video   I connected with patient on 11/23/19 at  2:00 PM EST by a video enabled telemedicine application and verified that I am speaking with the correct person using two identifiers.  Location patient: Home Location provider: Astron[facility], Office Persons participating in the virtual visit: Patient, Provider, CMA (Jess B)  I discussed the limitations of evaluation and management by telemedicine and the availability of in person appointments. The patient expressed understanding and agreed to proceed.  Subjective:   HPI:   Tobacco use- pt reports she enjoys smoking but is aware that it's unhealthy, not a good example for kids.  Smoking 1ppd.  This has been steady over the past 5 yrs.  Admits to smoking more when 'bored'.  Was able to quit cold [place] for both pregnancies.  Has used Wellbutrin in the past- was not effective in quitting smoking but doesn't recall any adverse side effects.  Used OTC lozenges previously that caused nausea  ROS:   See pertinent positives and negatives per HPI.  Patient Active Problem List   Diagnosis Date Noted   Migraine 09/14/2018   Eczema of eyelid 04/29/2018   Seasonal allergies 05/08/2017   IUD (intrauterine device) in place 05/08/2017   Hypothyroidism 09/03/2016   Anxiety and depression 09/03/2016   Insomnia 09/03/2016   Bipolar disorder in partial remission (HCC) 09/03/2016   Hx of bariatric surgery 09/03/2016   B12 deficiency 09/03/2016   Anemia, iron deficiency 09/03/2016   Body mass index (BMI) of 39.0 to 39.9 in adult 09/03/2016    Social History   Tobacco Use   Smoking status: Current Every Day Smoker    Packs/day: 1.00    Years: 14.00    Pack years: 14.00    Types: Cigarettes   Smokeless tobacco: Never Used  Substance Use Topics   Alcohol use: Yes    Comment: OCCASIONAL    Current Outpatient Medications:    diazepam (VALIUM) 2 MG tablet, Take 1 tablet (2 mg total) by mouth every 8  (eight) hours as needed (vertigo)., Disp: 15 tablet, Rfl: 0   fluticasone (FLONASE) 50 MCG/ACT nasal spray, SPRAY 2 SPRAYS INTO EACH NOSTRIL EVERY DAY, Disp: 16 g, Rfl: 0   QUEtiapine (SEROQUEL) 50 MG tablet, TAKE 3 TABLETS BY MOUTH EVERY DAY AT BEDTIME, Disp: 270 tablet, Rfl: 0   rizatriptan (MAXALT) 10 MG tablet, TAKE 1 TABLET BY MOUTH AS NEEDED FOR MIGRAINE. MAY REPEAT IN 2 HOURS IF NEEDED, Disp: 8 tablet, Rfl: 8   scopolamine (TRANSDERM-SCOP, 1.5 MG,) 1 MG/3DAYS, Place 1 patch (1.5 mg total) onto the skin every 3 (three) days., Disp: 10 patch, Rfl: 0   tacrolimus (PROTOPIC) 0.1 % ointment, Apply as directed, Disp: , Rfl:    triamcinolone cream (KENALOG) 0.1 %, Apply as directed, Disp: , Rfl:    zolpidem (AMBIEN CR) 12.5 MG CR tablet, TAKE 1 TABLET BY MOUTH AT BEDTIME AS NEEDED FOR SLEEP, Disp: 90 tablet, Rfl: 0  No Known Allergies  Objective:   There were no vitals taken for this visit. AAOx3, NAD NCAT, EOMI No obvious CN deficits Coloring WNL Pt is able to speak clearly, coherently without shortness of breath or increased work of breathing.  Thought process is linear.  Mood is appropriate.   Assessment and Plan:   Tobacco abuse- pt is at a stage where she would like to quit.  She knows all of the reasons for quitting but admits that she enjoys smoking.  Has previously tried OTC lozenges that  caused nausea and Wellbutrin that was ineffective.  She initially had reservations about Chantix due to her hx of Bipolar disorder.  I reviewed that in a newer study there was no increased risk of neuropsychiatric adverse events when compared to patches or gum.  She is willing to try.  Reviewed the 3 different quit strategies w/ her and placed a brochure along w/ a coupon at the front desk for pick up.  Will follow closely to ensure things are going well.  Pt expressed understanding and is in agreement w/ plan.    Annye Asa, MD 11/23/2019

## 2019-11-23 NOTE — Progress Notes (Signed)
I have discussed the procedure for the virtual visit with the patient who has given consent to proceed with assessment and treatment.   Pt unable to obtain vitals.   Jessica L Brodmerkel, CMA     

## 2019-11-28 NOTE — Progress Notes (Signed)
Virtual Visit via Video Note The purpose of this virtual visit is to provide medical care while limiting exposure to the novel coronavirus.    Consent was obtained for video visit:  Yes.   Answered questions that patient had about telehealth interaction:  Yes.   I discussed the limitations, risks, security and privacy concerns of performing an evaluation and management service by telemedicine. I also discussed with the patient that there may be a patient responsible charge related to this service. The patient expressed understanding and agreed to proceed.  Pt location: Home Physician Location: office Name of referring provider:  Sheliah Hatchabori, Katherine E, MD I connected with Monica Jefferson at patients initiation/request on 11/29/2019 at  1:50 PM EST by video enabled telemedicine application and verified that I am speaking with the correct person using two identifiers. Pt MRN:  [id number] Pt DOB:  08/19/1981 Video Participants:  Monica EonKeisha L Jefferson   History of Present Illness:  Monica Jefferson is a 38 year old female with Bipolar disorder, depression/anxiety who presents for migraines.  History supplemented by referring provider note.  Onset:  Childhood.   Location:  Left frontal, may become diffuse/face/periorbital, may radiate into neck Quality:  Dull ache progressing to throbbing Intensity:  severe.  She denies new headache, thunderclap headache Aura:  no Premonitory Phase:  none Postdrome:  fatigue Associated symptoms:  Nausea, photophobia, phonophobia, osmophobia.  She denies associated vomiting, visual disturbance, unilateral numbness or weakness. Duration:  May be a day but may occur off and on for 3 to 5 days Frequency:  Once a month Frequency of abortive medication: 2 to 3 days a week Triggers:  Emotional stress, seasonal allergies, perfumes/scented candles Relieving factors:  Closing eyes, rest Activity:  Daily activity aggravates She does have an underlying dull daily  headache.  Has seen neurology in the past.  She has had brain MRI.  Current NSAIDS:  Advil Current analgesics:  Tylenol Current triptans:  Maxalt 10mg  Current ergotamine:  none Current anti-emetic:  none Current muscle relaxants:  none Current anti-anxiolytic:  Valium PRN (for vertigo) Current sleep aide:  Seroquel 25mg  at bedtime Current Antihypertensive medications:  none Current Antidepressant/antipsychotic medications:  Ambien, Seroquel 25mg  at bedtime Current Anticonvulsant medications:  none Current anti-CGRP:  none Current Vitamins/Herbal/Supplements:  none Current Antihistamines/Decongestants:  Scopolamine patch; Flonase Other therapy:  none Hormone/birth control:  IUD  Past NSAIDS:  none Past analgesics:  none Past abortive triptans:  Sumatriptan 50mg  Past abortive ergotamine:  none Past muscle relaxants:  none Past anti-emetic:  none Past antihypertensive medications:  propranolol Past antidepressant medications:  sertraline Past anticonvulsant medications:  topiramate Past anti-CGRP:  none Past vitamins/Herbal/Supplements:  none Past antihistamines/decongestants:  meclizine Other past therapies:  none  Caffeine:  1 to 2 cans of Coke (usually caffeine-free) daily; No coffee.  Occasional frappe Smoker: 11 cigarettes a day.  Just started Chantix Diet:  2 Cokes a day. Exercise:  Not routine Depression:  stable; Anxiety:  yes Other pain:  Some back pain Sleep hygiene:  Poor.   Family history of headache:  mom  Past Medical History: Past Medical History:  Diagnosis Date  . Allergic rhinitis   . Anal fistula   . Anxiety   . B12 deficiency   . Bipolar disorder in partial remission (HCC)    FOLLOWED BY PCP  . Depression   . Eczema   . Frequent headaches   . History of hypothyroidism    per pt hx yrs ago  . Iron deficiency  anemia   . OSA (obstructive sleep apnea)    per pt dx  prior to gastric bypass in 2008 wore cpap for awhile but has not used in yrs  .  PONV (postoperative nausea and vomiting)    severe    Medications: Outpatient Encounter Medications as of 11/29/2019  Medication Sig  . diazepam (VALIUM) 2 MG tablet Take 1 tablet (2 mg total) by mouth every 8 (eight) hours as needed (vertigo).  . fluticasone (FLONASE) 50 MCG/ACT nasal spray SPRAY 2 SPRAYS INTO EACH NOSTRIL EVERY DAY  . QUEtiapine (SEROQUEL) 50 MG tablet TAKE 3 TABLETS BY MOUTH EVERY DAY AT BEDTIME  . rizatriptan (MAXALT) 10 MG tablet TAKE 1 TABLET BY MOUTH AS NEEDED FOR MIGRAINE. MAY REPEAT IN 2 HOURS IF NEEDED  . scopolamine (TRANSDERM-SCOP, 1.5 MG,) 1 MG/3DAYS Place 1 patch (1.5 mg total) onto the skin every 3 (three) days.  Marland Kitchen tacrolimus (PROTOPIC) 0.1 % ointment Apply as directed  . triamcinolone cream (KENALOG) 0.1 % Apply as directed  . varenicline (CHANTIX CONTINUING MONTH PAK) 1 MG tablet Take 1 tablet (1 mg total) by mouth 2 (two) times daily.  . varenicline (CHANTIX STARTING MONTH PAK) 0.5 MG X 11 & 1 MG X 42 tablet Take one 0.5 mg tablet by mouth once daily for 3 days, then increase to one 0.5 mg tablet twice daily for 4 days, then increase to one 1 mg tablet twice daily.  Marland Kitchen zolpidem (AMBIEN CR) 12.5 MG CR tablet TAKE 1 TABLET BY MOUTH AT BEDTIME AS NEEDED FOR SLEEP   No facility-administered encounter medications on file as of 11/29/2019.    Allergies: No Known Allergies  Family History: Family History  Problem Relation Age of Onset  . Mental illness Mother   . Alcohol abuse Father   . Mental illness Father   . Mental illness Maternal Aunt   . Mental illness Maternal Uncle   . Alcohol abuse Maternal Uncle   . Mental illness Maternal Grandmother     Social History: Social History   Socioeconomic History  . Marital status: Married    Spouse name: Not on file  . Number of children: Not on file  . Years of education: Not on file  . Highest education level: Not on file  Occupational History  . Not on file  Tobacco Use  . Smoking status: Current  Every Day Smoker    Packs/day: 1.00    Years: 14.00    Pack years: 14.00    Types: Cigarettes  . Smokeless tobacco: Never Used  Substance and Sexual Activity  . Alcohol use: Yes    Comment: OCCASIONAL  . Drug use: No  . Sexual activity: Yes    Birth control/protection: I.U.D.    Comment: Mirena IUD placed approx. 2016  Other Topics Concern  . Not on file  Social History Narrative  . Not on file   Social Determinants of Health   Financial Resource Strain:   . Difficulty of Paying Living Expenses: Not on file  Food Insecurity:   . Worried About Programme researcher, broadcasting/film/video in the Last Year: Not on file  . Ran Out of Food in the Last Year: Not on file  Transportation Needs:   . Lack of Transportation (Medical): Not on file  . Lack of Transportation (Non-Medical): Not on file  Physical Activity:   . Days of Exercise per Week: Not on file  . Minutes of Exercise per Session: Not on file  Stress:   . Feeling  of Stress : Not on file  Social Connections:   . Frequency of Communication with Friends and Family: Not on file  . Frequency of Social Gatherings with Friends and Family: Not on file  . Attends Religious Services: Not on file  . Active Member of Clubs or Organizations: Not on file  . Attends Archivist Meetings: Not on file  . Marital Status: Not on file  Intimate Partner Violence:   . Fear of Current or Ex-Partner: Not on file  . Emotionally Abused: Not on file  . Physically Abused: Not on file  . Sexually Abused: Not on file    Observations/Objective:   Height 5\' 4"  (1.626 m), weight 235 lb (106.6 kg). No acute distress.  Alert and oriented.  Speech fluent and not dysarthric.  Language intact.  Eyes orthophoric on primary gaze.  Face symmetric.  Assessment and Plan:   Chronic migraine without aura, without status migrainosus, not intractable.  1.  For preventative management, start Aimovig 70mg  monthly 2.  For abortive therapy, Nurtec 75mg  3.  Limit use of  pain relievers to no more than 2 days out of week to prevent risk of rebound or medication-overuse headache. 4.  Keep headache diary 5.  Exercise, hydration, caffeine cessation, sleep hygiene, monitor for and avoid triggers 6.  Consider:  magnesium citrate 400mg  daily, riboflavin 400mg  daily, and coenzyme Q10 100mg  three times daily 7. Follow up 4 months.  8.  Tobacco cessation counseling (CPT 99406):  Tobacco use with no history of CAD, stroke, or cancer  - Currently smoking less than 1 packs/day   - Patient was informed of the dangers of tobacco abuse including stroke, cancer, and MI, as well as benefits of tobacco cessation. - Patient is willing to quit at this time. - Approximately 4 mins were spent counseling patient cessation techniques. We discussed various methods to help quit smoking, including deciding on a date to quit, joining a support group, pharmacological agents- nicotine gum/patch/lozenges, chantix.  - I will reassess her progress at the next follow-up visit  Follow Up Instructions:    -I discussed the assessment and treatment plan with the patient. The patient was provided an opportunity to ask questions and all were answered. The patient agreed with the plan and demonstrated an understanding of the instructions.   The patient was advised to call back or seek an in-person evaluation if the symptoms worsen or if the condition fails to improve as anticipated.    Total Time spent in visit with the patient was:  40 minutes.   Dudley Major, DO

## 2019-11-29 ENCOUNTER — Telehealth (INDEPENDENT_AMBULATORY_CARE_PROVIDER_SITE_OTHER): Payer: BC Managed Care – PPO | Admitting: Neurology

## 2019-11-29 ENCOUNTER — Other Ambulatory Visit: Payer: Self-pay

## 2019-11-29 ENCOUNTER — Encounter: Payer: Self-pay | Admitting: Neurology

## 2019-11-29 VITALS — Ht 64.0 in | Wt 235.0 lb

## 2019-11-29 DIAGNOSIS — G43709 Chronic migraine without aura, not intractable, without status migrainosus: Secondary | ICD-10-CM | POA: Diagnosis not present

## 2019-11-29 DIAGNOSIS — Z716 Tobacco abuse counseling: Secondary | ICD-10-CM

## 2019-11-29 DIAGNOSIS — F17200 Nicotine dependence, unspecified, uncomplicated: Secondary | ICD-10-CM

## 2019-11-29 MED ORDER — NURTEC 75 MG PO TBDP: 1.0000 | ORAL_TABLET | ORAL | 11 refills | Status: DC | PRN

## 2019-11-29 MED ORDER — AIMOVIG 70 MG/ML ~~LOC~~ SOAJ: 70.0000 mg | SUBCUTANEOUS | 11 refills | Status: DC

## 2019-11-30 ENCOUNTER — Telehealth: Payer: Self-pay | Admitting: Family Medicine

## 2019-11-30 NOTE — Progress Notes (Signed)
Key: [id number] Rx #: [id number] Aimovig 70MG /ML auto-injectors    Your PA request has been sent to [place].   Wait for Determination

## 2019-11-30 NOTE — Telephone Encounter (Signed)
Please advise 

## 2019-11-30 NOTE — Telephone Encounter (Signed)
Pt made aware. Stated an understanding.  

## 2019-11-30 NOTE — Telephone Encounter (Signed)
Can start OTC Miralax daily.  Dissolve 1 capful in 8 oz of liquid.

## 2019-11-30 NOTE — Telephone Encounter (Signed)
Pt called in stating that she is on day 8 of chantix, she believes this is causing constipation, she wanted to know what Dr. Birdie Riddle recommended her to take for this or can she call something in for her. Pt can be reached at the cell #

## 2019-12-01 NOTE — Progress Notes (Signed)
Received response from plan  Status of request: approved  11-30-19 to 02/28/2020  Form sent to be scan

## 2019-12-22 ENCOUNTER — Other Ambulatory Visit: Payer: Self-pay | Admitting: Family Medicine

## 2019-12-28 ENCOUNTER — Ambulatory Visit: Payer: BC Managed Care – PPO | Admitting: Family Medicine

## 2020-01-04 ENCOUNTER — Other Ambulatory Visit: Payer: Self-pay | Admitting: Family Medicine

## 2020-01-04 DIAGNOSIS — G4700 Insomnia, unspecified: Secondary | ICD-10-CM

## 2020-01-04 NOTE — Telephone Encounter (Signed)
Last refill: 10.28.20 #90,0  Last OV: 12.9.20 dx. Nicotine dependence

## 2020-01-05 LAB — HM MAMMOGRAPHY

## 2020-01-10 ENCOUNTER — Encounter: Payer: Self-pay | Admitting: General Practice

## 2020-01-19 ENCOUNTER — Encounter: Payer: Self-pay | Admitting: Gastroenterology

## 2020-02-14 ENCOUNTER — Ambulatory Visit: Payer: 59 | Admitting: Gastroenterology

## 2020-02-14 ENCOUNTER — Encounter: Payer: Self-pay | Admitting: Gastroenterology

## 2020-02-14 ENCOUNTER — Other Ambulatory Visit (INDEPENDENT_AMBULATORY_CARE_PROVIDER_SITE_OTHER): Payer: 59

## 2020-02-14 VITALS — BP 110/64 | HR 80 | Temp 99.1°F | Ht 64.5 in | Wt 238.1 lb

## 2020-02-14 DIAGNOSIS — R197 Diarrhea, unspecified: Secondary | ICD-10-CM

## 2020-02-14 DIAGNOSIS — K603 Anal fistula: Secondary | ICD-10-CM

## 2020-02-14 DIAGNOSIS — Z01818 Encounter for other preprocedural examination: Secondary | ICD-10-CM

## 2020-02-14 DIAGNOSIS — K5900 Constipation, unspecified: Secondary | ICD-10-CM

## 2020-02-14 LAB — SEDIMENTATION RATE: Sed Rate: 43 mm/hr — ABNORMAL HIGH (ref 0–20)

## 2020-02-14 LAB — C-REACTIVE PROTEIN: CRP: <1 mg/dL (ref 0.5–20.0)

## 2020-02-14 MED ORDER — SUPREP BOWEL PREP KIT 17.5-3.13-1.6 GM/177ML PO SOLN: 1.0000 | ORAL | 0 refills | Status: DC

## 2020-02-14 MED ORDER — CITRUCEL PO POWD: 1.0000 | Freq: Every day | ORAL | Status: DC

## 2020-02-14 NOTE — Addendum Note (Signed)
Addended by: Lamona Curl on: 02/14/2020 03:47 PM   Modules accepted: Orders

## 2020-02-14 NOTE — Progress Notes (Signed)
HPI: This is a pleasant 39 year old woman who was referred to me by Sheliah Hatch, MD  to evaluate alternating bowel habits, history of perianal fistula.    She is a very tangential historian and that made it somewhat difficult to get a good history from her.  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  She is not sure why she had that.  She has never been told that she had Crohn's disease before.  Crohn's disease does not run in her family however.  She quit smoking back in December and became significantly constipated after that.  She started taking MiraLAX on a daily basis.  She has had a lot of rectal pressure some discomfort in her backside.  She does not see any blood but she is seeing mucus draining.  She has had generalized abdominal pains.  For the past few days she is actually had somediarrhea.  Her weight is overall stable.  She does not currently have a bowel regimen.    Old Data Reviewed:  She had a left-sided transsphincteric anal fistula.  [place]ette Pop was placed fall 2018 by Dr. Romie Levee.  February 2019 she underwent a ligation of the internal fistula tract in the OR.  Gastric bypass surgery 2008  Laparoscopic cholecystectomy 2007  Lap gastric banding early 2000's     Review of systems: Pertinent positive and negative review of systems were noted in the above HPI section. All other review negative.   Past Medical History:  Diagnosis Date  . Allergic rhinitis   . Anal fistula   . Anxiety   . B12 deficiency   . Bipolar disorder in partial remission (HCC)    FOLLOWED BY PCP  . Depression   . Eczema   . Frequent headaches   . Hemorrhoids   . History of hypothyroidism    per pt hx yrs ago  . Hypertension   . Iron deficiency anemia   . Obesity   . OSA (obstructive sleep apnea)    per pt dx  prior to gastric bypass in 2008 wore cpap for awhile but has not used in yrs  . PONV (postoperative nausea and vomiting)    severe    Past  Surgical History:  Procedure Laterality Date  . EVALUATION UNDER ANESTHESIA WITH ANAL FISTULECTOMY N/A 08/14/2017   Procedure: ANAL EXAM UNDER ANESTHESIA;  Surgeon: Romie Levee, MD;  Location: [facility];  Service: General;  Laterality: N/A;  . FOOT SURGERY Bilateral 1990s  . GASTRIC BYPASS  2008 approx.  Marland Kitchen LAPAROSCOPIC CHOLECYSTECTOMY  2007 approx  . LAPAROSCOPIC GASTRIC BANDING  early 2000s  . LEEP    . LIGATION OF INTERNAL FISTULA TRACT N/A 01/21/2018   Procedure: LIGATION OF INTERNAL FISTULA TRACT;  Surgeon: Romie Levee, MD;  Location: [facility];  Service: General;  Laterality: N/A;  . PLACEMENT OF SETON N/A 08/14/2017   Procedure: PLACEMENT OF SETON;  Surgeon: Romie Levee, MD;  Location: [facility];  Service: General;  Laterality: N/A;  . PLACEMENT OF SETON N/A 10/09/2017   Procedure: PLACEMENT OF SETON;  Surgeon: Romie Levee, MD;  Location: [facility] ;  Service: General;  Laterality: N/A;  . TONSILLECTOMY AND ADENOIDECTOMY  child  . WISDOM TOOTH EXTRACTION  teen  . WRIST GANGLION EXCISION Right teen    Current Outpatient Medications  Medication Sig Dispense Refill  . QUEtiapine (SEROQUEL) 50 MG tablet TAKE 3 TABLETS BY MOUTH EVERY DAY AT BEDTIME 270  tablet 0  . Rimegepant Sulfate (NURTEC) 75 MG TBDP Take 1 tablet by mouth as needed (Maximum 1 tablet in 24 hours.). 8 tablet 11  . scopolamine (TRANSDERM-SCOP, 1.5 MG,) 1 MG/3DAYS Place 1 patch (1.5 mg total) onto the skin every 3 (three) days. 10 patch 0  . tacrolimus (PROTOPIC) 0.1 % ointment Apply as directed    . triamcinolone cream (KENALOG) 0.1 % Apply as directed    . zolpidem (AMBIEN CR) 12.5 MG CR tablet TAKE 1 TABLET BY MOUTH EVERY DAY AT BEDTIME AS NEEDED FOR SLEEP 90 tablet 0  . Erenumab-aooe (AIMOVIG) 70 MG/ML SOAJ Inject 70 mg into the skin every 30 (thirty) days. (Patient not taking: Reported on 02/14/2020) 1 pen 11   No current facility-administered  medications for this visit.    Allergies as of 02/14/2020  . (No Known Allergies)    Family History  Problem Relation Age of Onset  . Mental illness Mother   . Diverticulitis Mother   . Alcohol abuse Father   . Mental illness Father   . Mental illness Maternal Aunt   . Mental illness Maternal Uncle   . Alcohol abuse Maternal Uncle   . Mental illness Maternal Grandmother   . Heart disease Maternal Grandmother   . Diabetes Maternal Grandmother     Social History   Socioeconomic History  . Marital status: Married    Spouse name: Not on file  . Number of children: 3  . Years of education: 54  . Highest education level: Not on file  Occupational History  . Occupation: self employed  Tobacco Use  . Smoking status: Former Smoker    Packs/day: 1.00    Years: 14.00    Pack years: 14.00    Types: Cigarettes    Quit date: 12/04/2019    Years since quitting: 0.1  . Smokeless tobacco: Never Used  Substance and Sexual Activity  . Alcohol use: Yes    Comment: 1-2 daily  . Drug use: No  . Sexual activity: Yes    Birth control/protection: I.U.D.    Comment: Mirena IUD placed approx. 2016  Other Topics Concern  . Not on file  Social History Narrative  . Not on file   Social Determinants of Health   Financial Resource Strain:   . Difficulty of Paying Living Expenses: Not on file  Food Insecurity:   . Worried About Programme researcher, broadcasting/film/video in the Last Year: Not on file  . Ran Out of Food in the Last Year: Not on file  Transportation Needs:   . Lack of Transportation (Medical): Not on file  . Lack of Transportation (Non-Medical): Not on file  Physical Activity:   . Days of Exercise per Week: Not on file  . Minutes of Exercise per Session: Not on file  Stress:   . Feeling of Stress : Not on file  Social Connections:   . Frequency of Communication with Friends and Family: Not on file  . Frequency of Social Gatherings with Friends and Family: Not on file  . Attends Religious  Services: Not on file  . Active Member of Clubs or Organizations: Not on file  . Attends Banker Meetings: Not on file  . Marital Status: Not on file  Intimate Partner Violence:   . Fear of Current or Ex-Partner: Not on file  . Emotionally Abused: Not on file  . Physically Abused: Not on file  . Sexually Abused: Not on file     Physical Exam:  BP 110/64 (BP Location: Left Arm, Patient Position: Sitting, Cuff Size: Large)   Pulse 80   Temp 99.1 F (37.3 C)   Ht 5' 4.5" (1.638 m) Comment: height measured without shoes  Wt 238 lb 2 oz (108 kg)   BMI 40.24 kg/m  Constitutional: generally well-appearing Psychiatric: alert and oriented x3 Eyes: extraocular movements intact Mouth: oral pharynx moist, no lesions Neck: supple no lymphadenopathy Cardiovascular: heart regular rate and rhythm Lungs: clear to auscultation bilaterally Abdomen: soft, nontender, nondistended, no obvious ascites, no peritoneal signs, normal bowel sounds Extremities: no lower extremity edema bilaterally Skin: no lesions on visible extremities Rectal examination with female assistant in the room: No obvious fistula drainage sites, no perianal abscesses, no anal fissures, no large hemorrhoids.  She did have a shelflike bit of tissue about 2 cm in from her anus.  This was not particularly tender.  Stool was brown and not checked for Hemoccult  Assessment and plan: 39 y.o. female with alternating constipation, diarrhea, rectal discomfort and burning, history of perianal fistula.  I do have some concern that she might have underlying inflammatory bowel disease, Crohn's disease.  I recommended a colonoscopy at her soonest convenience she is going to have some inflammatory markers checked as well as sedimentation rate and CRP.  She will start fiber supplement on a daily basis until then.   Please see the "Patient Instructions" section for addition details about the plan.   Owens Loffler, MD [facility] 02/14/2020, 2:53 PM  Cc: Midge Minium, MD  Total time on date of encounter was 45  minutes (this included time spent preparing to see the patient reviewing records; obtaining and/or reviewing separately obtained history; performing a medically appropriate exam and/or evaluation; counseling and educating the patient and family if present; ordering medications, tests or procedures if applicable; and documenting clinical information in the health record).

## 2020-02-14 NOTE — Patient Instructions (Addendum)
If you are age 41 or older, your body mass index should be between 23-30. Your Body mass index is 40.24 kg/m. If this is out of the aforementioned range listed, please consider follow up with your Primary Care Provider.  If you are age 38 or younger, your body mass index should be between 19-25. Your Body mass index is 40.24 kg/m. If this is out of the aformentioned range listed, please consider follow up with your Primary Care Provider.   Your provider has requested that you go to the basement level for lab work before leaving today. Press "B" on the elevator. The lab is located at the first door on the left as you exit the elevator.  You have been scheduled for a colonoscopy. Please follow written instructions given to you at your visit today.  Please pick up your prep supplies at the pharmacy within the next 1-3 days. If you use inhalers (even only as needed), please bring them with you on the day of your procedure.  Please purchase the following medications over the counter and take as directed: **Please start taking citrucel (orange flavored) powder fiber supplement.  This may cause some bloating at first but that usually goes away. Begin with a small spoonful and work your way up to a large, heaping spoonful daily over a week.  Due to recent changes in healthcare laws, you may see the results of your imaging and laboratory studies on MyChart before your provider has had a chance to review them.  We understand that in some cases there may be results that are confusing or concerning to you. Not all laboratory results come back in the same time frame and the provider may be waiting for multiple results in order to interpret others.  Please give [place] 48 hours in order for your provider to thoroughly review all the results before contacting the office for clarification of your results.   Thank you, Dr Christella Hartigan

## 2020-02-28 ENCOUNTER — Encounter: Payer: Self-pay | Admitting: Neurology

## 2020-02-28 NOTE — Progress Notes (Addendum)
Shirley Friar (Key: [id number]) Aimovig 70MG /ML auto-injectors   Form PA Form (NCPDP) Created 8 hours ago Sent to Plan 16 minutes ago Plan Response 16 minutes ago Submit Clinical Questions 14 minutes ago Determination Favorable 8 minutes ago Message from Ambulance person Your PA request has been approved. Approval from 02/28/20 to 02/27/21. Additional information will be provided in the approval communication. (Message [id number])

## 2020-02-29 ENCOUNTER — Telehealth: Payer: Self-pay | Admitting: Gastroenterology

## 2020-02-29 NOTE — Telephone Encounter (Signed)
Pt called to inform that she has been experiencing rectal discomfort/pain. She would like to know if her procedure could be moved up or something could be prescribed in the meantime. Her procedure is scheduled for 03/26/20.

## 2020-02-29 NOTE — Telephone Encounter (Signed)
The pt states she has rectal pain that seems to be internal.  She states she has used all "over the counter meds" (recticare, prep H, lidocaine supp.) and nothing has helped.  She does sitz baths and states that her bowels are soft.  She has a colon scheduled for 4/12.  She would like to know what to do in the meantime.  Procedure unable to be moved per pt request.  Please advise

## 2020-03-01 NOTE — Telephone Encounter (Signed)
Patient is returning your call.  

## 2020-03-01 NOTE — Telephone Encounter (Signed)
Spoke with the pt and advised her of Dr Christella Hartigan recommendations.  She states she will try but she does not think that this will work.  She was tearful on the phone and said that she is being passed around by doctors and nothing is being done for her.  She also made the comment that she was being allowed to stay in pain instead of prescribing medication.  I did try to move up the colon but nothing sooner that worked for her was available.  She states she will try the aleve and tylenol and call back if she gets no relief.

## 2020-03-01 NOTE — Telephone Encounter (Signed)
Left message on machine to call back  

## 2020-03-01 NOTE — Telephone Encounter (Signed)
I don't think any further local care will make much difference.  Advise alleve 200mg  once daily and tylenol 1gm BID.  Thanks

## 2020-03-22 ENCOUNTER — Other Ambulatory Visit: Payer: Self-pay

## 2020-03-22 ENCOUNTER — Ambulatory Visit (INDEPENDENT_AMBULATORY_CARE_PROVIDER_SITE_OTHER): Payer: 59

## 2020-03-22 ENCOUNTER — Encounter: Payer: Self-pay | Admitting: Gastroenterology

## 2020-03-22 ENCOUNTER — Other Ambulatory Visit: Payer: Self-pay | Admitting: Gastroenterology

## 2020-03-22 DIAGNOSIS — Z1159 Encounter for screening for other viral diseases: Secondary | ICD-10-CM

## 2020-03-23 LAB — SARS CORONAVIRUS 2 (TAT 6-24 HRS): SARS Coronavirus 2: NEGATIVE

## 2020-03-26 ENCOUNTER — Encounter: Payer: Self-pay | Admitting: Gastroenterology

## 2020-03-26 ENCOUNTER — Ambulatory Visit (AMBULATORY_SURGERY_CENTER): Payer: 59 | Admitting: Gastroenterology

## 2020-03-26 ENCOUNTER — Other Ambulatory Visit: Payer: Self-pay

## 2020-03-26 VITALS — BP 121/68 | HR 60 | Temp 96.8°F | Resp 17 | Ht 64.0 in | Wt 238.0 lb

## 2020-03-26 DIAGNOSIS — K5900 Constipation, unspecified: Secondary | ICD-10-CM | POA: Diagnosis not present

## 2020-03-26 DIAGNOSIS — K648 Other hemorrhoids: Secondary | ICD-10-CM

## 2020-03-26 MED ORDER — DILTIAZEM GEL 2 %: 1.0000 "application " | Freq: Three times a day (TID) | CUTANEOUS | 1 refills | Status: DC

## 2020-03-26 MED ORDER — SODIUM CHLORIDE 0.9 % IV SOLN: 500.0000 mL | Freq: Once | INTRAVENOUS | Status: DC

## 2020-03-26 MED ORDER — LIDOCAINE-PRILOCAINE 2.5-2.5 % EX CREA: 1.0000 "application " | TOPICAL_CREAM | Freq: Two times a day (BID) | CUTANEOUS | 0 refills | Status: DC

## 2020-03-26 NOTE — Op Note (Signed)
[facility] Patient Name: Monica Jefferson Procedure Date: 03/26/2020 1:24 PM MRN: [id number] Endoscopist: Milus Banister , MD Age: 39 Referring MD:  Date of Birth: Jan 23, 1981 Gender: Female Account #: [id number] Procedure:                Colonoscopy Indications:              Rectal pain Medicines:                Monitored Anesthesia Care Procedure:                Pre-Anesthesia Assessment:                           - Prior to the procedure, a History and Physical                            was performed, and patient medications and                            allergies were reviewed. The patient's tolerance of                            previous anesthesia was also reviewed. The risks                            and benefits of the procedure and the sedation                            options and risks were discussed with the patient.                            All questions were answered, and informed consent                            was obtained. Prior Anticoagulants: The patient has                            taken no previous anticoagulant or antiplatelet                            agents. ASA Grade Assessment: II - A patient with                            mild systemic disease. After reviewing the risks                            and benefits, the patient was deemed in                            satisfactory condition to undergo the procedure.                           After obtaining informed consent, the colonoscope  was passed under direct vision. Throughout the                            procedure, the patient's blood pressure, pulse, and                            oxygen saturations were monitored continuously. The                            Colonoscope was introduced through the anus and                            advanced to the the terminal ileum. The colonoscopy                            was performed without difficulty. The patient                            tolerated the procedure well. The quality of the                            bowel preparation was good. The terminal ileum,                            ileocecal valve, appendiceal orifice, and rectum                            were photographed. Scope In: 1:35:28 PM Scope Out: 1:45:10 PM Scope Withdrawal Time: 0 hours 6 minutes 54 seconds  Total Procedure Duration: 0 hours 9 minutes 42 seconds  Findings:                 The terminal ileum appeared normal.                           External hemorrhoids were found. The hemorrhoids                            were small.                           The exam was otherwise without abnormality on                            direct and retroflexion views. Complications:            No immediate complications. Estimated blood loss:                            None. Estimated Blood Loss:     Estimated blood loss: none. Impression:               - The examined portion of the ileum was normal.                           - Small external hemorrhoids.                           -  The examination was otherwise normal on direct                            and retroflexion views.                           - No polyps or cancers, no signs of IBD. Recommendation:           - Patient has a contact number available for                            emergencies. The signs and symptoms of potential                            delayed complications were discussed with the                            patient. Return to normal activities tomorrow.                            Written discharge instructions were provided to the                            patient.                           - Resume previous diet.                           - The was no obvious cause of your rectal, anal                            pains. Perhaps you have a very small anal fissure                            and so will start the regimen below that helps for                             fissures.                           - Diltiazem Gel ointment 2%, apply three times                            daily every day and then for 1 month after your                            pain is gone. Up to first knuckle.                           - Lidocaine 3% cream, apply BID daily. Disp 1                            month, no refills.                           -  Sitz baths twice daily.                           - Please start taking citrucel (orange flavored)                            powder fiber supplement. This may cause some                            bloating at first but that usually goes away. Begin                            with a small spoonful and work your way up to a                            large, heaping spoonful daily over a week.                           - Dr. Christella Hartigan' office will arrange for pelvic MRI                            for your rectal pain.                           - Will consider referral back to Dr. Maisie Fus pending                            the MRI results above. [name], MD 03/26/2020 1:56:53 PM This report has been signed electronically.

## 2020-03-26 NOTE — Progress Notes (Signed)
Report to PACU, RN, vss, BBS= Clear.  

## 2020-03-26 NOTE — Patient Instructions (Signed)
See medication details under recommendations on procedure report.  YOU HAD AN ENDOSCOPIC PROCEDURE TODAY AT THE [facility]:   Refer to the procedure report that was given to you for any specific questions about what was found during the examination.  If the procedure report does not answer your questions, please call your gastroenterologist to clarify.  If you requested that your care partner not be given the details of your procedure findings, then the procedure report has been included in a sealed envelope for you to review at your convenience later.  YOU SHOULD EXPECT: Some feelings of bloating in the abdomen. Passage of more gas than usual.  Walking can help get rid of the air that was put into your GI tract during the procedure and reduce the bloating. If you had a lower endoscopy (such as a colonoscopy or flexible sigmoidoscopy) you may notice spotting of blood in your stool or on the toilet paper. If you underwent a bowel prep for your procedure, you may not have a normal bowel movement for a few days.  Please Note:  You might notice some irritation and congestion in your nose or some drainage.  This is from the oxygen used during your procedure.  There is no need for concern and it should clear up in a day or so.  SYMPTOMS TO REPORT IMMEDIATELY:   Following lower endoscopy (colonoscopy or flexible sigmoidoscopy):  Excessive amounts of blood in the stool  Significant tenderness or worsening of abdominal pains  Swelling of the abdomen that is new, acute  Fever of 100F or higher  For urgent or emergent issues, a gastroenterologist can be reached at any hour by calling [phone]. Do not use MyChart messaging for urgent concerns.    DIET:  We do recommend a small meal at first, but then you may proceed to your regular diet.  Drink plenty of fluids but you should avoid alcoholic beverages for 24 hours.  ACTIVITY:  You should plan to take it easy for the rest of today and  you should NOT DRIVE or use heavy machinery until tomorrow (because of the sedation medicines used during the test).    FOLLOW UP: Our staff will call the number listed on your records 48-72 hours following your procedure to check on you and address any questions or concerns that you may have regarding the information given to you following your procedure. If we do not reach you, we will leave a message.  We will attempt to reach you two times.  During this call, we will ask if you have developed any symptoms of COVID 19. If you develop any symptoms (ie: fever, flu-like symptoms, shortness of breath, cough etc.) before then, please call [phone].  If you test positive for Covid 19 in the 2 weeks post procedure, please call and report this information to [place].    If any biopsies were taken you will be contacted by phone or by letter within the next 1-3 weeks.  Please call us at [phone] if you have not heard about the biopsies in 3 weeks.    SIGNATURES/CONFIDENTIALITY: You and/or your care partner have signed paperwork which will be entered into your electronic medical record.  These signatures attest to the fact that that the information above on your After Visit Summary has been reviewed and is understood.  Full responsibility of the confidentiality of this discharge information lies with you and/or your care-partner.

## 2020-03-26 NOTE — Progress Notes (Signed)
Pt's states no medical or surgical changes since previsit or office visit.  KA vitals, LC temp, SM IV

## 2020-03-28 ENCOUNTER — Telehealth: Payer: Self-pay

## 2020-03-28 ENCOUNTER — Telehealth: Payer: Self-pay | Admitting: *Deleted

## 2020-03-28 DIAGNOSIS — K6289 Other specified diseases of anus and rectum: Secondary | ICD-10-CM

## 2020-03-28 NOTE — Telephone Encounter (Signed)
Per colon report 03/26/20 pt needs pelvic MRI for rectal pain.    You have been scheduled for an MRI at [facility] on 04/09/20. Your appointment time is 7 pm. Please arrive 15 minutes prior to your appointment time for registration purposes. Please make certain not to have anything to eat or drink 6 hours prior to your test. In addition, if you have any metal in your body, have a pacemaker or defibrillator, please be sure to let your ordering physician know. This test typically takes 45 minutes to 1 hour to complete. Should you need to reschedule, please call [phone] to do so.  I spoke with the pt and she asked me to send the information to her My Chart

## 2020-03-28 NOTE — Telephone Encounter (Signed)
  Follow up Call-  Call back number 03/26/2020  Post procedure Call Back phone  # [phone]  Permission to leave phone message Yes  Some recent data might be hidden     Patient questions:  Do you have a fever, pain , or abdominal swelling? No. Pain Score  0 *  Have you tolerated food without any problems? Yes.    Have you been able to return to your normal activities? Yes.    Do you have any questions about your discharge instructions: Diet   No. Medications  No. Follow up visit  No.  Do you have questions or concerns about your Care? No.  Actions: * If pain score is 4 or above: No action needed, pain <4.  1. Have you developed a fever since your procedure? no  2.   Have you had an respiratory symptoms (SOB or cough) since your procedure? no  3.   Have you tested positive for COVID 19 since your procedure no  4.   Have you had any family members/close contacts diagnosed with the COVID 19 since your procedure?  no   If yes to any of these questions please route to Laverna Peace, RN and Charlett Lango, RN

## 2020-03-28 NOTE — Telephone Encounter (Signed)
1st follow up call made.  NALM 

## 2020-03-30 ENCOUNTER — Other Ambulatory Visit: Payer: Self-pay | Admitting: Family Medicine

## 2020-04-03 ENCOUNTER — Other Ambulatory Visit: Payer: Self-pay | Admitting: Family Medicine

## 2020-04-03 DIAGNOSIS — G4700 Insomnia, unspecified: Secondary | ICD-10-CM

## 2020-04-03 NOTE — Telephone Encounter (Signed)
Last OV 11/23/19 Zolpidem last filled 01/04/20 #90 with 0

## 2020-04-06 NOTE — Progress Notes (Signed)
Virtual Visit via Video Note The purpose of this virtual visit is to provide medical care while limiting exposure to the novel coronavirus.    Consent was obtained for video visit:  Yes.   Answered questions that patient had about telehealth interaction:  Yes.   I discussed the limitations, risks, security and privacy concerns of performing an evaluation and management service by telemedicine. I also discussed with the patient that there may be a patient responsible charge related to this service. The patient expressed understanding and agreed to proceed.  Pt location: Home Physician Location: office Name of referring provider:  Sheliah Hatch, MD I connected with Monica Eon Finger at patients initiation/request on 04/09/2020 at 10:30 AM EDT by video enabled telemedicine application and verified that I am speaking with the correct person using two identifiers. Pt MRN:  [id number] Pt DOB:  05/19/1981 Video Participants:  Monica Jefferson   History of Present Illness:  Monica Jefferson is a 39 year old female with Bipolar disorder, depression/anxiety who follows up for migraines.  UPDATE: She started Aimovig in January and was experiencing constipation, but hasn't taken it since then.  Stopped OTC analgesics and has less headache-days.    15 mild to moderate headache days a week.  1 severe headache lasting 4-5 days a month. Current NSAIDS:  Advil Current analgesics:  Tylenol Current triptans:  none Current ergotamine:  none Current anti-emetic:  none Current muscle relaxants:  none Current anti-anxiolytic:  Valium PRN (for vertigo) Current sleep aide:  Seroquel 25mg  at bedtime Current Antihypertensive medications:  none Current Antidepressant/antipsychotic medications:  Ambien, Seroquel 25mg  at bedtime Current Anticonvulsant medications:  none Current anti-CGRP:  Aimovig 70mg ; Nurtec (cuts headache down  Current Vitamins/Herbal/Supplements:  none Current  Antihistamines/Decongestants:  Scopolamine patch; Flonase Other therapy:  none Hormone/birth control:  IUD  Caffeine:  1 to 2 cans of Coke (usually caffeine-free) daily; No coffee.  Occasional frappe Smoker: 11 cigarettes a day.  Just started Chantix Diet:  2 Cokes a day. Exercise:  Not routine Depression:  stable; Anxiety:  yes Other pain:  Some back pain Sleep hygiene:  Poor.    HISTORY: Onset:  Childhood.   Location:  Left frontal, may become diffuse/face/periorbital, may radiate into neck Quality:  Dull ache progressing to throbbing Intensity:  severe.  She denies new headache, thunderclap headache Aura:  no Premonitory Phase:  none Postdrome:  fatigue Associated symptoms:  Nausea, photophobia, phonophobia, osmophobia.  She denies associated vomiting, visual disturbance, unilateral numbness or weakness. Duration:  May be a day but may occur off and on for 3 to 5 days Frequency:  Once a month Frequency of abortive medication: 2 to 3 days a week Triggers:  Emotional stress, seasonal allergies, perfumes/scented candles Relieving factors:  Closing eyes, rest Activity:  Daily activity aggravates She does have an underlying dull daily headache.  Has seen neurology in the past.  She has had brain MRI.  Past NSAIDS:  Advil Past analgesics:  Tylenol Past abortive triptans:  Sumatriptan 50mg ; Maxalt 10mg  Past abortive ergotamine:  none Past muscle relaxants:  none Past anti-emetic:  none Past antihypertensive medications:  propranolol Past antidepressant medications:  sertraline Past anticonvulsant medications:  topiramate Past anti-CGRP:  none Past vitamins/Herbal/Supplements:  none Past antihistamines/decongestants:  meclizine Other past therapies:  none   Family history of headache:  mom  Past Medical History: Past Medical History:  Diagnosis Date  . Allergic rhinitis   . Anal fistula   . Anxiety   .  B12 deficiency   . Bipolar disorder in partial remission (HCC)     FOLLOWED BY PCP  . Depression   . Eczema   . Frequent headaches   . Hemorrhoids   . History of hypothyroidism    per pt hx yrs ago  . Hypertension   . Iron deficiency anemia   . Obesity   . OSA (obstructive sleep apnea)    per pt dx  prior to gastric bypass in 2008 wore cpap for awhile but has not used in yrs  . PONV (postoperative nausea and vomiting)    severe    Medications: Outpatient Encounter Medications as of 04/09/2020  Medication Sig  . zolpidem (AMBIEN CR) 12.5 MG CR tablet TAKE 1 TABLET BY MOUTH AT BEDTIME AS NEEDED FOR SLEEP  . diltiazem 2 % GEL Apply 1 application topically 3 (three) times daily.  Dorise Hiss (AIMOVIG) 70 MG/ML SOAJ Inject 70 mg into the skin every 30 (thirty) days. (Patient not taking: Reported on 02/14/2020)  . lidocaine-prilocaine (EMLA) cream Apply 1 application topically 2 (two) times daily.  . methylcellulose (CITRUCEL) oral powder Take 1 packet by mouth daily. Begin with a small spoonful and work your way up to a large, heaping spoonful daily over a week.  Marland Kitchen QUEtiapine (SEROQUEL) 50 MG tablet TAKE 3 TABLETS BY MOUTH EVERY DAY AT BEDTIME  . Rimegepant Sulfate (NURTEC) 75 MG TBDP Take 1 tablet by mouth as needed (Maximum 1 tablet in 24 hours.).  Marland Kitchen scopolamine (TRANSDERM-SCOP, 1.5 MG,) 1 MG/3DAYS Place 1 patch (1.5 mg total) onto the skin every 3 (three) days.  Marland Kitchen tacrolimus (PROTOPIC) 0.1 % ointment Apply as directed  . triamcinolone cream (KENALOG) 0.1 % Apply as directed   No facility-administered encounter medications on file as of 04/09/2020.    Allergies: No Known Allergies  Family History: Family History  Problem Relation Age of Onset  . Mental illness Mother   . Diverticulitis Mother   . Alcohol abuse Father   . Mental illness Father   . Mental illness Maternal Aunt   . Mental illness Maternal Uncle   . Alcohol abuse Maternal Uncle   . Mental illness Maternal Grandmother   . Heart disease Maternal Grandmother   . Diabetes  Maternal Grandmother   . Colon polyps Neg Hx   . Esophageal cancer Neg Hx   . Stomach cancer Neg Hx   . Rectal cancer Neg Hx     Social History: Social History   Socioeconomic History  . Marital status: Married    Spouse name: Not on file  . Number of children: 3  . Years of education: 64  . Highest education level: Not on file  Occupational History  . Occupation: self employed  Tobacco Use  . Smoking status: Former Smoker    Packs/day: 1.00    Years: 14.00    Pack years: 14.00    Types: Cigarettes    Quit date: 12/04/2019    Years since quitting: 0.3  . Smokeless tobacco: Never Used  Substance and Sexual Activity  . Alcohol use: Yes    Comment: 1-2 daily  . Drug use: No  . Sexual activity: Yes    Birth control/protection: I.U.D.    Comment: Mirena IUD placed approx. 2016  Other Topics Concern  . Not on file  Social History Narrative  . Not on file   Social Determinants of Health   Financial Resource Strain:   . Difficulty of Paying Living Expenses:   Food Insecurity:   .  Worried About Charity fundraiser in the Last Year:   . Arboriculturist in the Last Year:   Transportation Needs:   . Film/video editor (Medical):   Marland Kitchen Lack of Transportation (Non-Medical):   Physical Activity:   . Days of Exercise per Week:   . Minutes of Exercise per Session:   Stress:   . Feeling of Stress :   Social Connections:   . Frequency of Communication with Friends and Family:   . Frequency of Social Gatherings with Friends and Family:   . Attends Religious Services:   . Active Member of Clubs or Organizations:   . Attends Archivist Meetings:   Marland Kitchen Marital Status:   Intimate Partner Violence:   . Fear of Current or Ex-Partner:   . Emotionally Abused:   Marland Kitchen Physically Abused:   . Sexually Abused:     Observations/Objective:   There were no vitals taken for this visit. No acute distress.  Alert and oriented.  Speech fluent and not dysarthric.  Language intact.   Eyes orthophoric on primary gaze.  Face symmetric.  Assessment and Plan:   Chronic migraine without aura, without status migrainosus, not intractable.  She has over 15 headache days a month for well over 3 consecutive months.  She has failed preventative medications such as topiramate, propranolol, Aimovig and sertraline.  Due to ongoing gastrointestinal symptoms, I would avoid an alternative CGRP inhibitor such as Emgality.  We will obtain prior authorization to start Botox.  1.  For preventative management, Botox 2.  For abortive therapy, Tosymra 3.  Limit use of pain relievers to no more than 2 days out of week to prevent risk of rebound or medication-overuse headache. 4.  Keep headache diary 5.  Exercise, hydration, caffeine cessation, sleep hygiene, monitor for and avoid triggers 6. Follow up for Botox   Follow Up Instructions:    -I discussed the assessment and treatment plan with the patient. The patient was provided an opportunity to ask questions and all were answered. The patient agreed with the plan and demonstrated an understanding of the instructions.   The patient was advised to call back or seek an in-person evaluation if the symptoms worsen or if the condition fails to improve as anticipated.    Dudley Major, DO

## 2020-04-09 ENCOUNTER — Ambulatory Visit (INDEPENDENT_AMBULATORY_CARE_PROVIDER_SITE_OTHER): Payer: 59 | Admitting: Neurology

## 2020-04-09 ENCOUNTER — Encounter: Payer: Self-pay | Admitting: Neurology

## 2020-04-09 ENCOUNTER — Ambulatory Visit (HOSPITAL_COMMUNITY)
Admission: RE | Admit: 2020-04-09 | Discharge: 2020-04-09 | Disposition: A | Discharge status: Home / Self Care | Payer: No Typology Code available for payment source | Source: Ambulatory Visit | Attending: Gastroenterology | Admitting: Gastroenterology

## 2020-04-09 ENCOUNTER — Other Ambulatory Visit: Payer: Self-pay

## 2020-04-09 ENCOUNTER — Encounter: Payer: Self-pay | Admitting: *Deleted

## 2020-04-09 DIAGNOSIS — G43709 Chronic migraine without aura, not intractable, without status migrainosus: Secondary | ICD-10-CM

## 2020-04-09 DIAGNOSIS — K6289 Other specified diseases of anus and rectum: Secondary | ICD-10-CM | POA: Insufficient documentation

## 2020-04-09 MED ORDER — GADOBUTROL 1 MMOL/ML IV SOLN
10.0000 mL | Freq: Once | INTRAVENOUS | Status: AC | PRN
  Administered 2020-04-09: 10 mL via INTRAVENOUS

## 2020-04-09 MED ORDER — TOSYMRA 10 MG/ACT NA SOLN: 1.0000 | NASAL | 11 refills | Status: DC | PRN

## 2020-04-09 NOTE — Progress Notes (Addendum)
Submitted to [employer] Benefit Verification BV-RGCEUAL Submitted! Please allow 24-48 hours for BV results. Requires PA so one was started in [facility] Fax Number [phone]   Monica Jefferson (Key: Holley Raring) - SRRNeed help? Call us at [phone] Status Sent to [employer] Next Steps The plan will fax you a determination, typically within 1 to 5 business days.  How do I follow up? Drug Botox 200UNIT solution Form [employer] Specialty Botox Injectable Precertification Form Precertification for Botox Requests [phone] [phone]

## 2020-04-10 NOTE — Progress Notes (Signed)
LIAHNA BRICKNER (Key: [id number]) [phone] Tosymra 10MG /ACT solution Status: PA Request  Created: April 27th, 2021 [id number]  Sent: April 27th, 2021

## 2020-04-12 NOTE — Progress Notes (Signed)
CARLIN MAMONE (Key: [id number]) Tosymra 10MG /ACT solution   Form [employer] Electronic PA Form [phone] NCPDP) Created 2 days ago Sent to Plan 2 days ago Plan Response 2 days ago Submit Clinical Questions 2 days ago Determination Unfavorable 1 day ago Message from Plan Your PA request has been denied. Additional information will be provided in the denial communication. (Message 1140) Your PA request has been denied. Additional information will be provided in the denial communication. (Message 1140)  Notice of Adverse Determination Date: 04/10/2020 04/12/2020 [address] [place], [address] [place], [place] Plan Member Name: ARYKA COONRADT Plan Member ID Shirley Friar Plan Name: [facility] [employer] Prescriber Name: [facility] Prescriber Phone: [phone] Prescriber Fax: [phone] Dear [phone]: [facility] received a request for coverage of Tosymra 10MG /ACT NA SOLN for you. This is the initial adverse determination for this request. The request was denied because: Your plan approved Tosymra NTM (FA-PA) criteria covers this drug when you have a contraindication to all the alternatives, or if complete and valid documentation is provided of you trying and having an inadequate treatment response or intolerance to the required number of formulary alternatives. Your use of this drug does not meet the requirement. This is based on the information that was provided. Formulary alternatives are: eletriptan, naratriptan, rizatriptan, rizatriptan ODT, sumatriptan, zolmitriptan, Onzetra Xsail, Zembrace Symtouch, Nurtec ODT, Reyvow, Ubrelvy, Zomig nasal spray. Requirement: Trial and failure of 3 or more in a class with at least 3 alternatives, 2 in a class with 2 alternatives, or 1 in a class with only 1 alternative. You may ask for a free copy of the actual benefit provision, guideline, protocol or other similar criterion used to make the decision and any other information related to  this decision by calling Customer Care toll-free at the number on your benefit ID card. You may also choose to purchase this medicine at your own expense. For more information regarding your prescription benefit, please refer to your benefit plan materials. If you disagree with this decision, you may ask for an appeal. Please mail or fax your appeal to: Prescription Claim Appeals [facility] - [facility] [address] [place] document contains references to brand-name prescription drugs that are trademarks or registered trademarks of pharmaceutical manufacturers not affiliated with [facility]. Your privacy is important to . Our employees are trained regarding the appropriate way to handle your private health information. [id number] [place] TDD/TTY: [phone] [place], [phone] [place] [phone]   Letter given to Dr. : to see if he wants to appeal. Original sent to scan into her chart

## 2020-04-27 ENCOUNTER — Other Ambulatory Visit: Payer: Self-pay

## 2020-04-27 ENCOUNTER — Ambulatory Visit (INDEPENDENT_AMBULATORY_CARE_PROVIDER_SITE_OTHER): Payer: 59 | Admitting: Neurology

## 2020-04-27 DIAGNOSIS — G43709 Chronic migraine without aura, not intractable, without status migrainosus: Secondary | ICD-10-CM | POA: Diagnosis not present

## 2020-04-27 MED ORDER — ONABOTULINUMTOXINA 100 UNITS IJ SOLR
200.0000 [IU] | Freq: Once | INTRAMUSCULAR | Status: AC
  Administered 2020-04-27: 155 [IU] via INTRAMUSCULAR

## 2020-04-27 NOTE — Progress Notes (Signed)
[facility]   Procedure Note Botox  Attending: Dr. Shon Millet  Preoperative Diagnosis(es): Chronic migraine  Consent obtained from: The patient Benefits discussed included, but were not limited to decreased muscle tightness, increased joint range of motion, and decreased pain.  Risk discussed included, but were not limited pain and discomfort, bleeding, bruising, excessive weakness, venous thrombosis, muscle atrophy and dysphagia.  Anticipated outcomes of the procedure as well as he risks and benefits of the alternatives to the procedure, and the roles and tasks of the personnel to be involved, were discussed with the patient, and the patient consents to the procedure and agrees to proceed. A copy of the patient medication guide was given to the patient which explains the blackbox warning.  Patients identity and treatment sites confirmed Yes.  .  Details of Procedure: Skin was cleaned with alcohol. Prior to injection, the needle plunger was aspirated to make sure the needle was not within a blood vessel.  There was no blood retrieved on aspiration.    Following is a summary of the muscles injected  And the amount of Botulinum toxin used:  Dilution 200 units of Botox was reconstituted with 4 ml of preservative free normal saline. Time of reconstitution: At the time of the office visit (<30 minutes prior to injection)   Injections  155 total units of Botox was injected with a 30 gauge needle.  Injection Sites: L occipitalis: 15 units- 3 sites  R occiptalis: 15 units- 3 sites  L upper trapezius: 15 units- 3 sites R upper trapezius: 15 units- 3 sits          L paraspinal: 10 units- 2 sites R paraspinal: 10 units- 2 sites  Face L frontalis(2 injection sites):10 units   R frontalis(2 injection sites):10 units         L corrugator: 5 units   R corrugator: 5 units           Procerus: 5 units   L temporalis: 20 units R temporalis: 20 units   Agent:  200 units of botulinum Type  A (Onobotulinum Toxin type A) was reconstituted with 4 ml of preservative free normal saline.  Time of reconstitution: At the time of the office visit (<30 minutes prior to injection)     Total injected (Units): 155  Total wasted (Units): 8  Patient tolerated procedure well without complications.   Reinjection is anticipated in 3 months. Return to clinic in 4 1/2 months

## 2020-06-25 ENCOUNTER — Other Ambulatory Visit: Payer: Self-pay | Admitting: Family Medicine

## 2020-06-29 ENCOUNTER — Other Ambulatory Visit: Payer: Self-pay | Admitting: Family Medicine

## 2020-06-29 DIAGNOSIS — G4700 Insomnia, unspecified: Secondary | ICD-10-CM

## 2020-06-29 NOTE — Telephone Encounter (Signed)
Zolpidem LFD 04/03/20 #90 with no refills LOV 11/23/19 NOV none

## 2020-07-27 ENCOUNTER — Ambulatory Visit: Payer: No Typology Code available for payment source | Admitting: Neurology

## 2020-07-31 ENCOUNTER — Telehealth: Payer: Self-pay | Admitting: Family Medicine

## 2020-07-31 NOTE — Telephone Encounter (Signed)
Patient is on day 9 of covid - is complaining of having a lot of sinus pressure and pain in ears, nose and behind the eyes.  Still has no taste or smell - Please advise if there is something that she can take for this.

## 2020-08-01 ENCOUNTER — Encounter: Payer: Self-pay | Admitting: General Practice

## 2020-08-01 NOTE — Telephone Encounter (Signed)
Please advise 

## 2020-08-01 NOTE — Telephone Encounter (Signed)
Pt wanted to know if she could use Fluticason, please advise

## 2020-08-01 NOTE — Telephone Encounter (Signed)
Unfortunately sinus sxs are pretty common with COVID.  She can use an OTC decongestant like phenylephrine, a nasal spray like Flonase or Nasonex, add a daily antihistamine like Claritin or Zyrtec to help w/ ear pressure and congestion.  Lots of fluids, lots of rest.

## 2020-08-01 NOTE — Telephone Encounter (Signed)
Called and left a detailed message to advise of PCP advice.

## 2020-08-01 NOTE — Telephone Encounter (Signed)
Tried calling pt, kept ringing. Also sent mychart message to advise ok to use.

## 2020-08-24 ENCOUNTER — Other Ambulatory Visit: Payer: Self-pay

## 2020-08-24 ENCOUNTER — Ambulatory Visit (INDEPENDENT_AMBULATORY_CARE_PROVIDER_SITE_OTHER): Payer: No Typology Code available for payment source | Admitting: Neurology

## 2020-08-24 DIAGNOSIS — G43709 Chronic migraine without aura, not intractable, without status migrainosus: Secondary | ICD-10-CM

## 2020-08-24 MED ORDER — ONABOTULINUMTOXINA 100 UNITS IJ SOLR
160.0000 [IU] | Freq: Once | INTRAMUSCULAR | Status: AC
  Administered 2020-08-24: 155 [IU] via INTRAMUSCULAR

## 2020-08-24 NOTE — Progress Notes (Signed)
[facility]  ° °Procedure Note Botox ° °Attending: Dr. Heiley Shaikh ° °Preoperative Diagnosis(es): Chronic migraine ° °Consent obtained from: The patient °Benefits discussed included, but were not limited to decreased muscle tightness, increased joint range of motion, and decreased pain.  Risk discussed included, but were not limited pain and discomfort, bleeding, bruising, excessive weakness, venous thrombosis, muscle atrophy and dysphagia.  Anticipated outcomes of the procedure as well as he risks and benefits of the alternatives to the procedure, and the roles and tasks of the personnel to be involved, were discussed with the patient, and the patient consents to the procedure and agrees to proceed. A copy of the patient medication guide was given to the patient which explains the blackbox warning. ° °Patients identity and treatment sites confirmed Yes.  . ° °Details of Procedure: °Skin was cleaned with alcohol. Prior to injection, the needle plunger was aspirated to make sure the needle was not within a blood vessel.  There was no blood retrieved on aspiration.   ° °Following is a summary of the muscles injected  And the amount of Botulinum toxin used: ° °Dilution °200 units of Botox was reconstituted with 4 ml of preservative free normal saline. °Time of reconstitution: At the time of the office visit (<30 minutes prior to injection)  ° °Injections  °155 total units of Botox was injected with a 30 gauge needle. ° °Injection Sites: °L occipitalis: 15 units- 3 sites  °R occiptalis: 15 units- 3 sites ° °L upper trapezius: 15 units- 3 sites °R upper trapezius: 15 units- 3 sits          °L paraspinal: 10 units- 2 sites °R paraspinal: 10 units- 2 sites ° °Face °L frontalis(2 injection sites):10 units   °R frontalis(2 injection sites):10 units         °L corrugator: 5 units   °R corrugator: 5 units           °Procerus: 5 units   °L temporalis: 20 units °R temporalis: 20 units  ° °Agent:  °200 units of botulinum Type  A (Onobotulinum Toxin type A) was reconstituted with 4 ml of preservative free normal saline.  °Time of reconstitution: At the time of the office visit (<30 minutes prior to injection)  ° ° ° Total injected (Units):  155 ° Total wasted (Units):  5 ° °Patient tolerated procedure well without complications.   °Reinjection is anticipated in 3 months. ° ° °

## 2020-09-04 NOTE — Progress Notes (Signed)
NEUROLOGY FOLLOW UP OFFICE NOTE  JAMYIAH LABELLA [id number]  HISTORY OF PRESENT ILLNESS: Patt L. Kingsford is a 39 year old female with Bipolar disorder, depression/anxiety who follows up for migraine.  UPDATE:  In April, recommended to start Botox.  She is status post 2 rounds.   Over past 30 days, she reports 7 headache days.  She had one severe migraine from night into the next morning.  She took Tosymra but headache still persisted into next morning.  She reports tension in the shoulders are better.  However, she reports difficulty raising her right eyebrow. Current NSAIDS:Advil (occasional, about once a week) Current analgesics:none Current triptans:none Current ergotamine:none Current anti-emetic:none Current muscle relaxants:none Current anti-anxiolytic:Valium PRN (for vertigo) Current sleep aide:Seroquel 25mg  at bedtime Current Antihypertensive medications:none Current Antidepressant/antipsychoticmedications: Ambien, Seroquel 25mg  at bedtime Current Anticonvulsant medications:none Current anti-CGRP:Nurtec (cuts headache down  Current Vitamins/Herbal/Supplements:none Current Antihistamines/Decongestants:Scopolamine patch; Flonase Other therapy:none Hormone/birth control:IUD  Caffeine:1 to 2 cans of Coke (usually caffeine-free) daily; No coffee. Occasional frappe Smoker:11 cigarettes a day. Just started Chantix Diet:2 Cokes a day. Exercise:Not routine Depression:stable; Anxiety:yes Other pain:Some back pain Sleep hygiene:Poor.She has insomnia and requests to see a specialist.  HISTORY: Onset:Childhood. Location:Left frontal, may become diffuse/face/periorbital, may radiate into neck Quality:Dull ache progressing to throbbing Intensity:severe.Shedenies new headache, thunderclap headache Aura:no Premonitory Phase:none Postdrome:fatigue Associated symptoms:Nausea, photophobia, phonophobia,  osmophobia.Shedenies associated vomiting, visual disturbance,unilateral numbness or weakness. Duration:May be a day but may occur off and on for 3 to 5 days Frequency:Once a month Frequency of abortive medication:2 to 3 days a week Triggers:Emotional stress, seasonal allergies, perfumes/scented candles Relieving factors:Closing eyes, rest Activity:Daily activity aggravates She does have an underlying dull daily headache.  Has seen neurology in the past. She has had brain MRI.  Past NSAIDS:none Past analgesics:Tylenol Past abortive triptans:Sumatriptan 50mg ; Maxalt 10mg , Tosymra (caused nausea) Past abortive ergotamine:none Past muscle relaxants:none Past anti-emetic:none Past antihypertensive medications:propranolol Past antidepressant medications:sertraline Past anticonvulsant medications:topiramate Past anti-CGRP:Aimovig 70mg  (constipation) Past vitamins/Herbal/Supplements:none Past antihistamines/decongestants:meclizine Other past therapies:none   Family history of headache:mom  PAST MEDICAL HISTORY: Past Medical History:  Diagnosis Date  . Allergic rhinitis   . Anal fistula   . Anxiety   . B12 deficiency   . Bipolar disorder in partial remission (HCC)    FOLLOWED BY PCP  . Depression   . Eczema   . Frequent headaches   . Hemorrhoids   . History of hypothyroidism    per pt hx yrs ago  . Hypertension   . Iron deficiency anemia   . Obesity   . OSA (obstructive sleep apnea)    per pt dx  prior to gastric bypass in 2008 wore cpap for awhile but has not used in yrs  . PONV (postoperative nausea and vomiting)    severe    MEDICATIONS: Current Outpatient Medications on File Prior to Visit  Medication Sig Dispense Refill  . diltiazem 2 % GEL Apply 1 application topically 3 (three) times daily. 1 Package 1  . Erenumab-aooe (AIMOVIG) 70 MG/ML SOAJ Inject 70 mg into the skin every 30 (thirty) days. (Patient not taking:  Reported on 02/14/2020) 1 pen 11  . lidocaine-prilocaine (EMLA) cream Apply 1 application topically 2 (two) times daily. 30 g 0  . QUEtiapine (SEROQUEL) 50 MG tablet TAKE 3 TABLETS BY MOUTH EVERY DAY AT BEDTIME 270 tablet 0  . Rimegepant Sulfate (NURTEC) 75 MG TBDP Take 1 tablet by mouth as needed (Maximum 1 tablet in 24 hours.). 8 tablet 11  . scopolamine (  TRANSDERM-SCOP, 1.5 MG,) 1 MG/3DAYS Place 1 patch (1.5 mg total) onto the skin every 3 (three) days. 10 patch 0  . SUMAtriptan (TOSYMRA) 10 MG/ACT SOLN Place 1 spray into the nose every hour as needed (Maximum 3 sprays in 24 hours). 6 each 11  . tacrolimus (PROTOPIC) 0.1 % ointment Apply as directed    . triamcinolone cream (KENALOG) 0.1 % Apply as directed    . zolpidem (AMBIEN CR) 12.5 MG CR tablet TAKE 1 TABLET BY MOUTH EVERY DAY AT BEDTIME AS NEEDED FOR SLEEP 90 tablet 0   No current facility-administered medications on file prior to visit.    ALLERGIES: No Known Allergies  FAMILY HISTORY: Family History  Problem Relation Age of Onset  . Mental illness Mother   . Diverticulitis Mother   . Alcohol abuse Father   . Mental illness Father   . Mental illness Maternal Aunt   . Mental illness Maternal Uncle   . Alcohol abuse Maternal Uncle   . Mental illness Maternal Grandmother   . Heart disease Maternal Grandmother   . Diabetes Maternal Grandmother   . Colon polyps Neg Hx   . Esophageal cancer Neg Hx   . Stomach cancer Neg Hx   . Rectal cancer Neg Hx     SOCIAL HISTORY: Social History   Socioeconomic History  . Marital status: Married    Spouse name: Not on file  . Number of children: 3  . Years of education: 13  . Highest education level: Not on file  Occupational History  . Occupation: self employed  Tobacco Use  . Smoking status: Former Smoker    Packs/day: 1.00    Years: 14.00    Pack years: 14.00    Types: Cigarettes    Quit date: 12/04/2019    Years since quitting: 0.7  . Smokeless tobacco: Never Used    Vaping Use  . Vaping Use: Never used  Substance and Sexual Activity  . Alcohol use: Yes    Comment: 1-2 daily  . Drug use: No  . Sexual activity: Yes    Birth control/protection: I.U.D.    Comment: Mirena IUD placed approx. 2016  Other Topics Concern  . Not on file  Social History Narrative  . Not on file   Social Determinants of Health   Financial Resource Strain:   . Difficulty of Paying Living Expenses: Not on file  Food Insecurity:   . Worried About Programme researcher, broadcasting/film/video in the Last Year: Not on file  . Ran Out of Food in the Last Year: Not on file  Transportation Needs:   . Lack of Transportation (Medical): Not on file  . Lack of Transportation (Non-Medical): Not on file  Physical Activity:   . Days of Exercise per Week: Not on file  . Minutes of Exercise per Session: Not on file  Stress:   . Feeling of Stress : Not on file  Social Connections:   . Frequency of Communication with Friends and Family: Not on file  . Frequency of Social Gatherings with Friends and Family: Not on file  . Attends Religious Services: Not on file  . Active Member of Clubs or Organizations: Not on file  . Attends Banker Meetings: Not on file  . Marital Status: Not on file  Intimate Partner Violence:   . Fear of Current or Ex-Partner: Not on file  . Emotionally Abused: Not on file  . Physically Abused: Not on file  . Sexually Abused: Not on  file    PHYSICAL EXAM: Blood pressure 126/89, weight 242 lb (109.8 kg). General: No acute distress.  Patient appears well-groomed.   Head:  Normocephalic/atraumatic Eyes:  Fundi examined but not visualized Neck: supple, no paraspinal tenderness, full range of motion Heart:  Regular rate and rhythm Lungs:  Clear to auscultation bilaterally Back: No paraspinal tenderness Neurological Exam: alert and oriented to person, place, and time. Attention span and concentration intact, recent and remote memory intact, fund of knowledge intact.   Speech fluent and not dysarthric, language intact.  CN II-XII intact. Bulk and tone normal, muscle strength 5/5 throughout.  Sensation to light touch, temperature and vibration intact.  Deep tendon reflexes 2+ throughout, toes downgoing.  Finger to nose and heel to shin testing intact.  Gait normal, Romberg negative.  IMPRESSION: Chronic migraine without aura, without status migrainosus, not intractable  PLAN: 1.  For preventative management, we will continue with Botox for now, as it has been helpful. 2.  For abortive therapy, she will try Reyvow 3.  Limit use of pain relievers to no more than 2 days out of week to prevent risk of rebound or medication-overuse headache. 4.  Keep headache diary 5.  Exercise, hydration, caffeine cessation, sleep hygiene, monitor for and avoid triggers 6.  Consider:  magnesium citrate 400mg  daily, riboflavin 400mg  daily, and coenzyme Q10 100mg  three times daily 7. Will refer her to sleep medicine at [facility] for evaluation and treatment of insomnia 8. Follow up for next Botox in 3 months and office visit in 4 1/2 months.   , DO  CC: , MD

## 2020-09-06 ENCOUNTER — Encounter: Payer: Self-pay | Admitting: Neurology

## 2020-09-06 ENCOUNTER — Ambulatory Visit (INDEPENDENT_AMBULATORY_CARE_PROVIDER_SITE_OTHER): Payer: No Typology Code available for payment source | Admitting: Neurology

## 2020-09-06 ENCOUNTER — Other Ambulatory Visit: Payer: Self-pay

## 2020-09-06 VITALS — BP 126/89 | Wt 242.0 lb

## 2020-09-06 DIAGNOSIS — G43709 Chronic migraine without aura, not intractable, without status migrainosus: Secondary | ICD-10-CM

## 2020-09-06 DIAGNOSIS — G4700 Insomnia, unspecified: Secondary | ICD-10-CM

## 2020-09-06 NOTE — Patient Instructions (Signed)
1.  Continue Botox 2.  When you get a migraine, try Reyvow, one tablet.  No more than 1 tablet in 24 hours.  Advised no driving for 8 hours after use.  If effective, contact me for prescription. 3.  Will refer you to [facility] for insomnia 4.  Limit use of pain relievers to no more than 2 days out of week to prevent risk of rebound or medication-overuse headache. 5.  Keep headache diary 6.  Follow up in 4 1/2 months.

## 2020-09-20 NOTE — Progress Notes (Signed)
Per BV: SP is [facility]. They are no longer taking new patients so the only option is to [place] for patient.

## 2020-09-26 ENCOUNTER — Other Ambulatory Visit: Payer: Self-pay | Admitting: Family Medicine

## 2020-09-26 DIAGNOSIS — G4700 Insomnia, unspecified: Secondary | ICD-10-CM

## 2020-09-27 NOTE — Telephone Encounter (Signed)
Last OV 11/23/19 Zolpidem last filled 06/29/20 #90 with 0

## 2020-09-28 ENCOUNTER — Encounter: Payer: Self-pay | Admitting: Neurology

## 2020-09-28 NOTE — Progress Notes (Signed)
Monica Jefferson (Key: [id number])Need help? Call us at [phone] Status Sent to [facility] October 14 Next Steps The plan will fax you a determination, typically within 1 to 5 business days.  How do I follow up? Drug Botox 200UNIT solution Form [employer] Specialty Botox Injectable Precertification Form Precertification for Botox Requests [phone]  09/28/20- received fax with additional forms to complete. Sent back same day

## 2020-09-30 ENCOUNTER — Other Ambulatory Visit: Payer: Self-pay | Admitting: Family Medicine

## 2020-10-01 NOTE — Progress Notes (Signed)
Received approval fax from [facility] with Botox Approval valid from 10/28/20 to 04/25/21. Case #: [id number].

## 2020-10-15 ENCOUNTER — Ambulatory Visit: Payer: No Typology Code available for payment source | Admitting: Neurology

## 2020-10-15 ENCOUNTER — Encounter: Payer: Self-pay | Admitting: Neurology

## 2020-10-15 ENCOUNTER — Other Ambulatory Visit: Payer: Self-pay

## 2020-10-15 VITALS — BP 134/91 | HR 79 | Ht 64.0 in | Wt 238.0 lb

## 2020-10-15 DIAGNOSIS — F3175 Bipolar disorder, in partial remission, most recent episode depressed: Secondary | ICD-10-CM

## 2020-10-15 DIAGNOSIS — Z6841 Body Mass Index (BMI) 40.0 and over, adult: Secondary | ICD-10-CM

## 2020-10-15 DIAGNOSIS — F99 Mental disorder, not otherwise specified: Secondary | ICD-10-CM

## 2020-10-15 DIAGNOSIS — G4721 Circadian rhythm sleep disorder, delayed sleep phase type: Secondary | ICD-10-CM | POA: Diagnosis not present

## 2020-10-15 DIAGNOSIS — F5105 Insomnia due to other mental disorder: Secondary | ICD-10-CM

## 2020-10-15 NOTE — Patient Instructions (Signed)

## 2020-10-15 NOTE — Progress Notes (Signed)
SLEEP MEDICINE CLINIC    Provider:  Melvyn Novas, MD  Primary Care Physician:  Sheliah Hatch, MD [address] [place] [address]     Referring Provider: Dr Adriana Mccallum        Chief Complaint according to patient   Patient presents with:    . New Patient (Initial Visit)     "alone, rm 10. presents today for long time concerns of insomnia and not feeling well rested. 10+ years ago she was started on [place] which is no longer effective for her all the time. indicates that she did have a PSG or HST  > 5 yrs ago and it showed mild OSA. she started treatment CPAP but then stopped. wanting to discuss alternative treatment option for helping her sleep".      HISTORY OF PRESENT ILLNESS:  Monica Jefferson is a 39  Year- old Caucasian female patient seen here upon consultation request by Dr Everlena Cooper, DO on 10/15/2020. Chief concern  : " I just want to go to sleep", Migraines treated by dr Everlena Cooper who felt that mysleep may be contributing to my headaches, waking up with headaches."    I have the pleasure of seeing Monica Jefferson on 10-15-2020, a right-handed Caucasian female with a possible sleep disorder.  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  The patient reports that was in.CMED And as a young child not asleep when I was in her family slept.  By age 28 or 5 it was clear that she had a different sleep and day rhythm from her parents -her siblings are 23 and 64 years younger and later were also sleep impaired - by hypersomnia.  She also reports that her mother had the ability to nap and the ability to sleep at night but it came cyclic there were phases in her life and sleep seem to have been  very irregular and she also used to work night shifts.  Her father's medical and mental health history is not well known as he is no longer living in the [place], was deported. Around 2005-6 he was a guest at her wedding. Around middle school and high school she had problems with sleep again insomnia.  She reports that she would clean or organize her room in the middle of the night, she left to have the TV on all night.  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 She finished High-School but she felt she slipped though.     The patient had the first sleep study in the year 2015 at [place], [place], with Dr.S. Chales Abrahams-   a result of an AHI ( Apnea Hypopnea index) was high enough to diagnose OSA- she has gained weight since then, now reaching BMI of 40.85 kg/m2.  Has had bariatric surgery- gastric band  In [place] and revisited to gastric bypass.   Sleep relevant medical history: No Nocturia- Sleep eating - but she feels she is awake - she had tonsil- adenoid and ear tube surgery-deviated septum - but never repaired. Hypothyroidism at the time of her first pregnancy. Bariatric surgery.  Family medical /sleep history: mother with insomnia, half sister with hypersomnia. No sleep walkers.    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  Pets are not  present. Tobacco use- quit 11-2019.  ETOH use ; yes, 6 / week ,  Caffeine intake in form of Coffee( /) Soda( cokes- 1 in AM, ) Tea (/) or energy drinks. Regular exercise- none .    Sleep habits are as follows: No TV in the bedroom- The patient goes to bed at 11.30PM and will, most nights be asleep by 1-2 hours(!) once asleep, she continues to sleep for 5-6 hours, wakes at 9-10 AM, needs an alarm. Sleep mask. .   The preferred sleep position is variable , with the  support of 1-2 pillows. Dreams are reportedly frequent/vivid   10 AM is the usual rise time.  She reports not often feeling refreshed or restored in AM, with symptoms such as dry mouth frequent morning headaches , and residual fatigue.  Naps are taken seldomly and make her feel worse.    Review of Systems: Out of a complete 14 system review, the patient complains of only the following symptoms, and all other reviewed systems are negative.:  Fatigue, sleepiness , snoring,  unfragmented sleep,  delayed sleep phase syndrome, long latency- insomnia. Morning headaches.   How likely are you to doze in the following situations: 0 = not likely, 1 = slight chance, 2 = moderate chance, 3 = high chance   Sitting and Reading? Watching Television? Sitting inactive in a public place (theater or meeting)? As a passenger in a car for an hour without a break? Lying down in the afternoon when circumstances permit? Sitting and talking to someone? Sitting quietly after lunch without alcohol? In a car, while stopped for a few minutes in traffic?   Total = 0-1/ 24 points   FSS endorsed at 35/ 63 points.   Social History   Socioeconomic History  . Marital status: Married    Spouse name: Not on file  . Number of children: 3  . Years of education: 22  . Highest education level: Not on file  Occupational History  . Occupation: self employed  Tobacco Use  . Smoking status: Former Smoker    Packs/day: 1.00    Years: 14.00    Pack years: 14.00    Types: Cigarettes    Quit date: 12/04/2019    Years since quitting: 0.8  . Smokeless tobacco: Never Used  Vaping Use  . Vaping Use: Never used  Substance and Sexual Activity  . Alcohol use: Yes    Comment: 1-2 daily  . Drug use: No  . Sexual activity: Yes    Birth control/protection: I.U.D.    Comment: Mirena IUD placed approx. 2016  Other Topics Concern  . Not on file  Social History Narrative  . Not on file   Social Determinants of Health    Financial Resource Strain:   . Difficulty of Paying Living Expenses: Not on file  Food Insecurity:   . Worried About Programme researcher, broadcasting/film/video in the Last Year: Not on file  . Ran Out of Food in the Last Year: Not on file  Transportation Needs:   . Lack of Transportation (Medical): Not on file  . Lack of Transportation (Non-Medical): Not on file  Physical Activity:   . Days of Exercise per Week: Not on file  . Minutes of Exercise per Session: Not on  file  Stress:   . Feeling of Stress : Not on file  Social Connections:   . Frequency of Communication with Friends and Family: Not on file  . Frequency of Social Gatherings with Friends and Family: Not on file  . Attends Religious Services: Not on file  . Active Member of Clubs or Organizations: Not on file  . Attends Banker Meetings: Not on file  . Marital Status: Not on file    Family History  Problem Relation Age of Onset  . Mental illness Mother   . Diverticulitis Mother   . Alcohol abuse Father   . Mental illness Father   . Mental illness Maternal Aunt   . Mental illness Maternal Uncle   . Alcohol abuse Maternal Uncle   . Mental illness Maternal Grandmother   . Heart disease Maternal Grandmother   . Diabetes Maternal Grandmother   . Colon polyps Neg Hx   . Esophageal cancer Neg Hx   . Stomach cancer Neg Hx   . Rectal cancer Neg Hx     Past Medical History:  Diagnosis Date  . Allergic rhinitis   . Anal fistula   . Anxiety   . B12 deficiency   . 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 Depression   . Eczema   . Frequent headaches Migraines- morning headaches.    . Hemorrhoids   . History of hypothyroidism    per pt hx yrs ago  . Hypertension   . Iron deficiency anemia   . Obesity snoring   . OSA (obstructive sleep apnea)    per pt dx  prior to gastric bypass in 2008 wore cpap for awhile but has not used in yrs  . PONV (postoperative nausea and vomiting)    severe     Past Surgical History:  Procedure Laterality Date  . EVALUATION UNDER ANESTHESIA WITH ANAL FISTULECTOMY N/A 08/14/2017   Procedure: ANAL EXAM UNDER ANESTHESIA;  Surgeon: Romie Levee, MD;  Location: [facility] [place];  Service: General;  Laterality: N/A;  . FOOT SURGERY Bilateral 1990s  . GASTRIC BYPASS  2008 approx.  Marland Kitchen LAPAROSCOPIC CHOLECYSTECTOMY  2007 approx  . LAPAROSCOPIC GASTRIC BANDING  early 2000s  . LEEP    . LIGATION OF INTERNAL FISTULA TRACT N/A 01/21/2018   Procedure: LIGATION OF INTERNAL FISTULA TRACT;  Surgeon: Romie Levee, MD;  Location: [facility] [place];  Service: General;  Laterality: N/A;  . PLACEMENT OF SETON N/A 08/14/2017   Procedure: PLACEMENT OF SETON;  Surgeon: Romie Levee, MD;  Location: [facility] [place];  Service: General;  Laterality: N/A;  . PLACEMENT OF SETON N/A 10/09/2017   Procedure: PLACEMENT OF SETON;  Surgeon: Romie Levee, MD;  Location: [facility] [place];  Service: General;  Laterality: N/A;  . TONSILLECTOMY AND ADENOIDECTOMY  child  . WISDOM TOOTH EXTRACTION  teen  . WRIST GANGLION EXCISION Right teen     Current Outpatient Medications on File Prior to Visit  Medication Sig Dispense Refill  . Erenumab-aooe (AIMOVIG) 70 MG/ML SOAJ Inject 70 mg into the skin every 30 (thirty) days. 1 pen 11  . lidocaine-prilocaine (EMLA) cream Apply 1 application topically 2 (two) times daily. 30 g 0  . QUEtiapine (SEROQUEL) 50 MG tablet TAKE 3 TABLETS BY MOUTH EVERY DAY AT BEDTIME 270 tablet 0  . Rimegepant Sulfate (NURTEC) 75 MG TBDP Take 1 tablet by mouth as needed (Maximum 1 tablet in 24 hours.).  8 tablet 11  . scopolamine (TRANSDERM-SCOP, 1.5 MG,) 1 MG/3DAYS Place 1 patch (1.5 mg total) onto the skin every 3 (three) days. 10 patch 0  . SUMAtriptan (TOSYMRA) 10 MG/ACT SOLN Place 1 spray into the nose every hour as needed (Maximum 3 sprays in 24 hours). 6 each 11  . tacrolimus (PROTOPIC) 0.1 % ointment Apply as  directed    . triamcinolone cream (KENALOG) 0.1 % Apply as directed    . zolpidem (AMBIEN CR) 12.5 MG CR tablet TAKE 1 TABLET BY MOUTH AT BEDTIME AS NEEDED FOR SLEEP 90 tablet 0  . diltiazem 2 % GEL Apply 1 application topically 3 (three) times daily. 1 Package 1   No current facility-administered medications on file prior to visit.    Physical exam:  Today's Vitals   10/15/20 1117  BP: (!) 134/91  Pulse: 79  Weight: 238 lb (108 kg)  Height: 5\' 4"  (1.626 m)   Body mass index is 40.85 kg/m.   Wt Readings from Last 3 Encounters:  10/15/20 238 lb (108 kg)  09/06/20 242 lb (109.8 kg)  03/26/20 238 lb (108 kg)     Ht Readings from Last 3 Encounters:  10/15/20 5\' 4"  (1.626 m)  03/26/20 5\' 4"  (1.626 m)  02/14/20 5' 4.5" (1.638 m)      General: The patient is awake, alert and appears not in acute distress.  Head: Normocephalic, atraumatic. Neck is supple. Mallampati   2, lateral crowding. neck circumference:15 inches . Nasal airflow patent.  Retrognathia is not  seen.  Dental status: native  With crowns.  Cardiovascular:  Regular rate and cardiac rhythm by pulse,  without distended neck veins. Respiratory: Lungs are clear to auscultation.  Skin:  Without evidence of ankle edema, or rash.tattooed.  Trunk: The patient's posture is erect.    Neurologic exam : The patient is awake and alert, oriented to place and time.   Memory subjective described as intact.  Attention span & concentration ability appears normal.  Speech is fluent,  without  dysarthria, dysphonia or aphasia.  Mood and affect are appropriate.   Cranial nerves: no loss of smell or taste reported  Pupils are equal and briskly reactive to light. Funduscopic exam deferred.  Extraocular movements in vertical and horizontal planes were intact and without nystagmus. No Diplopia. Visual fields by finger perimetry are intact. Hearing was intact to soft voice and finger rubbing. Facial sensation intact to fine touch.   Facial motor strength is symmetric and tongue and uvula move midline.  Neck ROM : rotation, tilt and flexion extension were normal for age and shoulder shrug was symmetrical.    Motor exam:  Symmetric bulk, tone and ROM.   Normal tone without cog- wheeling, symmetric grip strength .   Sensory:  Fine touch, pinprick and vibration were tested  and  normal.  Proprioception tested in the upper extremities was normal.   Coordination: Rapid alternating movements in the fingers/hands were of normal speed.  The Finger-to-nose maneuver was intact without evidence of ataxia, dysmetria or tremor.   Gait and station: Patient could rise unassisted from a seated position, walked without assistive device.  Stance is of normal width/ base and the patient turned with 3 steps.  Toe and heel walk were deferred.  Deep tendon reflexes: in the  upper and lower extremities are symmetric and intact.  Babinski response was deferred.      CONSULTATION : After spending a total time of 45  minutes face to face and  additional time for physical and neurologic examination, review of laboratory studies,  personal review of imaging studies, reports and results of other testing and review of referral information / records as far as provided in visit, I have established the following assessments:  1)  Monica Jefferson, a 39 year -old caucasian female patient of Dr. Beather Arbour presents today with a lifelong history of poorly restorative sleep and prolonged sleep latency.  This seems to be more of a cyclic pattern which has accompanied her through childhood and early adulthood to today.  The patient seems to follow a more delayed sleep phase, related to bed, significant delay in sleep onset, followed by 5, 6 sometimes 8 hours of sleep. Some mornings she feels refreshed and alert she does not, she snores, she has regained weight but also lost significant amount of weight through her adulthood in relationship to previous bariatric  surgeries.  Her last sleep study must have diagnosed obstructive sleep apnea she was put on a CPAP but has not used this in years.  She felt not even better on CPAP ever so she is not keen on repeating that experience.  When I am trying to do today is to find out if she still has an organic sleep disorder and I will be happy to perform a home sleep test to see if apnea is present, but even if apnea is present we will not pursue positive airway pressure if she does not like it.  I can offer a dental device for some milder forms of apnea, as to her delayed sleep phase she has slept better since taking Ambien.   I would be resistant to prescribe Ambien on an ongoing basis / and by now it has become a medication for the last 10 years, beginning with the birth of her first child.  Cyclic sleep disorders are also endorsed , and this is a pattern typical for bipolar disorder. By needing sleep more in depression, it is  harder to come by while anxious.    My Plan is to proceed with:  1) rule in/ rule out OSA.  Ordered HST   2) bipolar depression, continue seeing your counselor. Continue with seroquel.    3) morning headaches- rule out hypoxemia.     I would like to thank Wyline Copas, DO and  Sheliah Hatch, Md [address] [place],  [place] [address] for allowing me to meet with and to take care of this pleasant patient.   In short, Monica Jefferson is presenting with a life long  Cyclic sleep disorder  a symptom that can be attributed to underlying  bipolar   I plan to follow up either personally or through our NP within 2-4 month.   CC: I will share my notes with Dr Everlena Cooper.   Electronically signed by: Melvyn Novas, MD 10/15/2020 11:38 AM  [facility] Board certified by The [employer] of Sleep Medicine and Diplomate of the [employer] of Sleep Medicine. Board certified In Neurology through the ABPN, Fellow of the [employer] of  Neurology. Medical Director of [facility].

## 2020-10-26 ENCOUNTER — Ambulatory Visit: Payer: No Typology Code available for payment source | Admitting: Neurology

## 2020-11-05 ENCOUNTER — Ambulatory Visit (INDEPENDENT_AMBULATORY_CARE_PROVIDER_SITE_OTHER): Payer: No Typology Code available for payment source | Admitting: Neurology

## 2020-11-05 DIAGNOSIS — F3175 Bipolar disorder, in partial remission, most recent episode depressed: Secondary | ICD-10-CM

## 2020-11-05 DIAGNOSIS — G4721 Circadian rhythm sleep disorder, delayed sleep phase type: Secondary | ICD-10-CM

## 2020-11-05 DIAGNOSIS — G4710 Hypersomnia, unspecified: Secondary | ICD-10-CM | POA: Diagnosis not present

## 2020-11-05 DIAGNOSIS — F99 Mental disorder, not otherwise specified: Secondary | ICD-10-CM

## 2020-11-05 DIAGNOSIS — F5105 Insomnia due to other mental disorder: Secondary | ICD-10-CM

## 2020-11-15 NOTE — Progress Notes (Signed)
   [facility]  HOME SLEEP TEST (Watch PAT)  STUDY DATE: 11/05/20  DOB: [birth year]  MRN: [id number]  ORDERING CLINICIAN: Melvyn Novas, MD   REFERRING CLINICIAN: Dr Adriana Mccallum  CLINICAL INFORMATION/HISTORY: Monica Jefferson was first seen on 10-15-2020 in a sleep  Consultation. She is a right-handed Caucasian female with a possible sleep disorder.  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 The patient reports that was even as a young child not asleep when all her family slept in bed.   By age 54 or 5 it was clear that she had a different circadian rhythm from her parents -her siblings are 36 and 59 years younger and later in life were also sleep impaired -but by hypersomnia. She also reports that her mother had the ability to nap and the ability to sleep at night but in cycles, that there were phases in her mom's life when sleep seemed to have been very irregular - she also used to work night shifts.  Her father's medical and mental health history is not well known .  Epworth sleepiness score: 1/24.  BMI: 40.6 kg/m  FINDINGS:   Total Record Time (hours, min): 9 h 56 min  Total Sleep Time (hours, min):  8 h 45 min   Percent REM (%):    27.33 %   Calculated pAHI (per hour):  3.4       REM pAHI:    4.8     NREM pAHI: 2.5 Supine AHI: N/A   Oxygen Saturation (%) Mean: 95  Minimum oxygen saturation (%):         87   O2 Saturation Range (%): 87-98  O2Saturation (minutes) <=88%: 0.2  Pulse Mean (bpm):    80  Pulse Range (54-119)   IMPRESSION: There is no evidence of OSA (obstructive sleep apnea) in this HST. No oxygen desaturation, and normal heart rate variability were also noted. Total  recorded sleep time was more than 8 hours.   RECOMMENDATION:  Sleep disordered breathing is not a cause for this patients excessive sleepiness or sleep rhythm problems. In the differential is Hypersomnia due to medication side effect and Insomnia due to bipolar depression.       INTERPRETING PHYSICIAN:  Melvyn Novas, MD  [facility] Board certified by The [employer] of Sleep Medicine and Diplomate of the [employer] of Sleep Medicine. Board certified In Neurology through the ABPN, Fellow of the [employer] of Neurology. Medical Director of [facility].

## 2020-11-19 NOTE — Progress Notes (Signed)
No evidence of OSA,   IMPRESSION: There is no evidence of OSA (obstructive sleep apnea)  in this HST. No oxygen desaturation, and normal heart rate  variability were also noted. Total recorded sleep time was more  than 8 hours.    RECOMMENDATION:  Sleep disordered breathing is not a cause for this patients  excessive sleepiness, insomnia or sleep rhythm problems.  In the differential is Hypersomnia due to medication side effect  and Insomnia due to bipolar depression.   A RV in the sleep clinic is not needed.

## 2020-11-19 NOTE — Procedures (Signed)
  [facility]  HOME SLEEP TEST (Watch PAT)  STUDY DATE: 11/05/20  DOB: 07/10/1981  MRN: [id number]  ORDERING CLINICIAN: Branden Vine, MD   REFERRING CLINICIAN: Dr Jaffee  CLINICAL INFORMATION/HISTORY: Monica Jefferson was first seen on 10-15-2020 in a sleep  Consultation. She is a right-handed Caucasian female with a possible sleep disorder.  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 The patient reports that was even as a young child not asleep when all her family slept in bed.   By age 4 or 5 it was clear that she had a different circadian rhythm from her parents -her siblings are 13 and 15 years younger and later in life were also sleep impaired -but by hypersomnia. She also reports that her mother had the ability to nap and the ability to sleep at night but in cycles, that there were phases in her mom's life when sleep seemed to have been very irregular - she also used to work night shifts.  Her father's medical and mental health history is not well known .  Epworth sleepiness score: 1/24.  BMI: 40.6 kg/m  FINDINGS:   Total Record Time (hours, min): 9 h 56 min  Total Sleep Time (hours, min):  8 h 45 min   Percent REM (%):    27.33 %   Calculated pAHI (per hour):  3.4       REM pAHI:    4.8     NREM pAHI: 2.5 Supine AHI: N/A   Oxygen Saturation (%) Mean: 95  Minimum oxygen saturation (%):         87   O2 Saturation Range (%): 87-98  O2Saturation (minutes) <=88%: 0.2  Pulse Mean (bpm):    80  Pulse Range (54-119)   IMPRESSION: There is no evidence of OSA (obstructive sleep apnea) in this HST. No oxygen desaturation, and normal heart rate variability were also noted. Total  recorded sleep time was more than 8 hours.   RECOMMENDATION:  Sleep disordered breathing is not a cause for this patients excessive sleepiness or sleep rhythm problems. In the differential is Hypersomnia due to medication side effect and Insomnia due to bipolar depression.       INTERPRETING PHYSICIAN:  Shantina Chronister, MD  [facility] Board certified by The American Board of Sleep Medicine and Diplomate of the American Academy of Sleep Medicine. Board certified In Neurology through the ABPN, Fellow of the American Academy of Neurology. Medical Director of [facility].    

## 2020-11-21 ENCOUNTER — Telehealth: Payer: Self-pay | Admitting: Neurology

## 2020-11-21 ENCOUNTER — Encounter: Payer: Self-pay | Admitting: Neurology

## 2020-11-21 NOTE — Telephone Encounter (Signed)
-----   Message from Melvyn Novas, MD sent at 11/19/2020  4:44 PM EST ----- No evidence of OSA,   IMPRESSION: There is no evidence of OSA (obstructive sleep apnea)  in this HST. No oxygen desaturation, and normal heart rate  variability were also noted. Total recorded sleep time was more  than 8 hours.    RECOMMENDATION:  Sleep disordered breathing is not a cause for this patients  excessive sleepiness, insomnia or sleep rhythm problems.  In the differential is Hypersomnia due to medication side effect  and Insomnia due to bipolar depression.   A RV in the sleep clinic is not needed.

## 2020-11-21 NOTE — Telephone Encounter (Signed)
Called patient to discuss sleep study results. No answer at this time. LVM for the patient to call back.   

## 2020-11-23 ENCOUNTER — Ambulatory Visit: Payer: No Typology Code available for payment source | Admitting: Neurology

## 2020-12-21 ENCOUNTER — Ambulatory Visit (INDEPENDENT_AMBULATORY_CARE_PROVIDER_SITE_OTHER): Payer: No Typology Code available for payment source | Admitting: Neurology

## 2020-12-21 ENCOUNTER — Other Ambulatory Visit: Payer: Self-pay

## 2020-12-21 DIAGNOSIS — G43709 Chronic migraine without aura, not intractable, without status migrainosus: Secondary | ICD-10-CM

## 2020-12-21 MED ORDER — ONABOTULINUMTOXINA 100 UNITS IJ SOLR
155.0000 [IU] | Freq: Once | INTRAMUSCULAR | Status: AC
  Administered 2020-12-21: 155 [IU] via INTRAMUSCULAR

## 2020-12-21 NOTE — Progress Notes (Signed)
[facility]   Procedure Note Botox  Attending: Dr. Nashly Olsson  Preoperative Diagnosis(es): Chronic migraine  Consent obtained from: The patient Benefits discussed included, but were not limited to decreased muscle tightness, increased joint range of motion, and decreased pain.  Risk discussed included, but were not limited pain and discomfort, bleeding, bruising, excessive weakness, venous thrombosis, muscle atrophy and dysphagia.  Anticipated outcomes of the procedure as well as he risks and benefits of the alternatives to the procedure, and the roles and tasks of the personnel to be involved, were discussed with the patient, and the patient consents to the procedure and agrees to proceed. A copy of the patient medication guide was given to the patient which explains the blackbox warning.  Patients identity and treatment sites confirmed Yes.  .  Details of Procedure: Skin was cleaned with alcohol. Prior to injection, the needle plunger was aspirated to make sure the needle was not within a blood vessel.  There was no blood retrieved on aspiration.    Following is a summary of the muscles injected  And the amount of Botulinum toxin used:  Dilution 200 units of Botox was reconstituted with 4 ml of preservative free normal saline. Time of reconstitution: At the time of the office visit (<30 minutes prior to injection)   Injections  155 total units of Botox was injected with a 30 gauge needle.  Injection Sites: L occipitalis: 15 units- 3 sites  R occiptalis: 15 units- 3 sites  L upper trapezius: 15 units- 3 sites R upper trapezius: 15 units- 3 sits          L paraspinal: 10 units- 2 sites R paraspinal: 10 units- 2 sites  Face L frontalis(2 injection sites):10 units   R frontalis(2 injection sites):10 units         L corrugator: 5 units   R corrugator: 5 units           Procerus: 5 units   L temporalis: 20 units R temporalis: 20 units   Agent:  200 units of botulinum Type  A (Onobotulinum Toxin type A) was reconstituted with 4 ml of preservative free normal saline.  Time of reconstitution: At the time of the office visit (<30 minutes prior to injection)     Total injected (Units):  155  Total wasted (Units):  None wasted  Patient tolerated procedure well without complications.   Reinjection is anticipated in 3 months.   

## 2020-12-24 ENCOUNTER — Other Ambulatory Visit: Payer: Self-pay | Admitting: Family Medicine

## 2020-12-24 DIAGNOSIS — G4700 Insomnia, unspecified: Secondary | ICD-10-CM

## 2020-12-25 ENCOUNTER — Other Ambulatory Visit: Payer: Self-pay | Admitting: Physician Assistant

## 2020-12-25 DIAGNOSIS — G4700 Insomnia, unspecified: Secondary | ICD-10-CM

## 2020-12-25 MED ORDER — ZOLPIDEM TARTRATE ER 12.5 MG PO TBCR: 12.5000 mg | EXTENDED_RELEASE_TABLET | Freq: Every evening | ORAL | 0 refills | Status: DC | PRN

## 2020-12-25 NOTE — Telephone Encounter (Signed)
Seroquel last rx 10/01/20 #270 for 90 day supply LOV: 10/12/19 Bipolar disorder  Ambien last rx 09/28/20 #90  Patient is over due for an appointment (bipolar or CPE)

## 2020-12-25 NOTE — Telephone Encounter (Signed)
Okay to give 30-day supply of her Seroquel until she has follow-up with Dr. Beverely Low. Ambien is controlled and since she has not been seen within a year I will not refill.

## 2021-01-01 ENCOUNTER — Telehealth (INDEPENDENT_AMBULATORY_CARE_PROVIDER_SITE_OTHER): Payer: No Typology Code available for payment source | Admitting: Family Medicine

## 2021-01-01 ENCOUNTER — Encounter: Payer: Self-pay | Admitting: Family Medicine

## 2021-01-01 DIAGNOSIS — F3175 Bipolar disorder, in partial remission, most recent episode depressed: Secondary | ICD-10-CM | POA: Diagnosis not present

## 2021-01-01 DIAGNOSIS — Z720 Tobacco use: Secondary | ICD-10-CM | POA: Diagnosis not present

## 2021-01-01 DIAGNOSIS — G4700 Insomnia, unspecified: Secondary | ICD-10-CM

## 2021-01-01 MED ORDER — QUETIAPINE FUMARATE 50 MG PO TABS
ORAL_TABLET | ORAL | 0 refills | Status: DC
Start: 2021-01-01 — End: 2021-04-08

## 2021-01-01 MED ORDER — ZOLPIDEM TARTRATE ER 12.5 MG PO TBCR: 12.5000 mg | EXTENDED_RELEASE_TABLET | Freq: Every evening | ORAL | 0 refills | Status: DC | PRN

## 2021-01-01 NOTE — Progress Notes (Signed)
I connected with  Monica Jefferson on 01/01/21 by a video enabled telemedicine application and verified that I am speaking with the correct person using two identifiers.   I discussed the limitations of evaluation and management by telemedicine. The patient expressed understanding and agreed to proceed.

## 2021-01-01 NOTE — Progress Notes (Signed)
Virtual Visit via Video   I connected with patient on 01/01/21 at 11:00 AM EST by a video enabled telemedicine application and verified that I am speaking with the correct person using two identifiers.  Location patient: Home Location provider: [facility], Office Persons participating in the virtual visit: Patient, Provider, CMA (Sabrina M)  I discussed the limitations of evaluation and management by telemedicine and the availability of in person appointments. The patient expressed understanding and agreed to proceed.  Subjective:   HPI:   Bipolar disorder- chronic problem, on Seroquel 150mg  daily.  Feels current medication regimen is good.  Has not done well w/ med changes previously.  Denies manic or hypomanic episodes.  Insomnia- chronic problem, on Ambien 10mg  nightly.  Continues to struggle w/ sleep.  Pt finds herself becoming anxious about whether she will be able to fall asleep.  Has a consistent nightly routine.  Has trouble both falling asleep and staying asleep.  On the nights she is able to sleep, she gets good sleep and has trouble getting up.  Tobacco use- was able to quit smoking!  Took Chantix for a few weeks but 'felt terrible' and had nightmares.  Has been smoke free x13 months!!!  ROS:   See pertinent positives and negatives per HPI.  Patient Active Problem List   Diagnosis Date Noted  . Migraine 09/14/2018  . 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 Seasonal allergies 05/08/2017  . IUD (intrauterine device) in place 05/08/2017  . Hypothyroidism 09/03/2016  . Anxiety and depression 09/03/2016  . Insomnia 09/03/2016  . 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 Hx of bariatric surgery 09/03/2016  . B12 deficiency 09/03/2016  . Anemia, iron deficiency 09/03/2016  . Body mass index (BMI) of 39.0 to 39.9 in adult 09/03/2016    Social History   Tobacco Use  . Smoking status: Former Smoker    Packs/day: 1.00    Years: 14.00    Pack years:  14.00    Types: Cigarettes    Quit date: 12/04/2019    Years since quitting: 1.0  . Smokeless tobacco: Never Used  Substance Use Topics  . Alcohol use: Yes    Comment: 1-2 daily    Current Outpatient Medications:  .  QUEtiapine (SEROQUEL) 50 MG tablet, Take 3 tablets by mouth daily. Is overdue for appointment. Please call the office to schedule., Disp: 90 tablet, Rfl: 0 .  Rimegepant Sulfate (NURTEC) 75 MG TBDP, Take 1 tablet by mouth as needed (Maximum 1 tablet in 24 hours.)., Disp: 8 tablet, Rfl: 11 .  scopolamine (TRANSDERM-SCOP, 1.5 MG,) 1 MG/3DAYS, Place 1 patch (1.5 mg total) onto the skin every 3 (three) days., Disp: 10 patch, Rfl: 0 .  tacrolimus (PROTOPIC) 0.1 % ointment, Apply as directed, Disp: , Rfl:  .  triamcinolone cream (KENALOG) 0.1 %, Apply as directed, Disp: , Rfl:  .  zolpidem (AMBIEN CR) 12.5 MG CR tablet, Take 1 tablet (12.5 mg total) by mouth at bedtime as needed. for sleep, Disp: 7 tablet, Rfl: 0 .  diltiazem 2 % GEL, Apply 1 application topically 3 (three) times daily., Disp: 1 Package, Rfl: 1 .  Erenumab-aooe (AIMOVIG) 70 MG/ML SOAJ, Inject 70 mg into the skin every 30 (thirty) days. (Patient not taking: Reported on 01/01/2021), Disp: 1 pen, Rfl: 11 .  lidocaine-prilocaine (EMLA) cream, Apply 1 application topically 2 (two) times daily. (Patient not taking: Reported on 01/01/2021), Disp: 30 g, Rfl: 0 .  SUMAtriptan (TOSYMRA) 10 MG/ACT SOLN, Place  1 spray into the nose every hour as needed (Maximum 3 sprays in 24 hours). (Patient not taking: Reported on 01/01/2021), Disp: 6 each, Rfl: 11  No Known Allergies  Objective:   There were no vitals taken for this visit. AAOx3, NAD NCAT, EOMI No obvious CN deficits Coloring WNL Pt is able to speak clearly, coherently without shortness of breath or increased work of breathing.  Thought process is linear.  Mood is appropriate.   Assessment and Plan:   Bipolar- chronic problem.  Pt feels sxs are stable for now.  Denies  mania or hypomania.  Struggles more w/ depression.  Is looking for a counselor to work through some of her concerns- which is a big step for her and I applauded her efforts. Is not interested in changing meds at this time.  90 day supply sent.  Insomnia- ongoing issue for pt.  Feels things are stable and manageable on Ambien but are still not great.  Again, not interested in making med changes at this time.  90 day prescription sent  Tobacco abuse- pt has been able to successfully quit x13 months!  She states she doesn't feel any cravings and has been quite successful.  Applauded her efforts.  Neena Rhymes, MD 01/01/2021

## 2021-01-03 ENCOUNTER — Telehealth: Payer: Self-pay | Admitting: Family Medicine

## 2021-01-03 NOTE — Telephone Encounter (Signed)
When I told the pt I was with [facility] Covid Vaccine, she said I would like to decline this call.  I thanked her and hung up.

## 2021-01-23 NOTE — Progress Notes (Unsigned)
NEUROLOGY FOLLOW UP OFFICE NOTE  Monica Jefferson [id number]   Subjective:  Monica Jefferson is a 40 year old female with Bipolar disorder, depression/anxiety whofollows up for migraine.  UPDATE: Intensity:  dull Duration:  2 hours with Advil Frequency:  1 a week No severe migraines Current NSAIDS:Advil (occasional, about once a week) Current analgesics:none Current triptans:none Current ergotamine:none Current anti-emetic:none Current muscle relaxants:none Current anti-anxiolytic:Valium PRN (for vertigo) Current sleep aide:Ambien, Seroquel 150mg  at bedtime Current Antihypertensive medications:none Current Antidepressant/antipsychoticmedications: Ambien, Seroquel 25mg  at bedtime Current Anticonvulsant medications:none Current anti-CGRP:none Current Vitamins/Herbal/Supplements:none Current Antihistamines/Decongestants:Scopolamine patch; Flonase Other therapy:Reyvow (rescue) Hormone/birth control:IUD  Caffeine:1 to 2 cans of Coke (usually caffeine-free) daily; No coffee. Occasional frappe Smoker:11 cigarettes a day. Just started Chantix Diet:2 Cokes a day. Exercise:Not routine Depression:stable; Anxiety:yes Other pain:Some back pain Sleep hygiene:Poor.She has insomnia.  She saw sleep medicine.  Sleep study did not demonstrate sleep apnea.    HISTORY: Onset:Childhood. Location:Left frontal, may become diffuse/face/periorbital, may radiate into neck Quality:Dull ache progressing to throbbing Intensity:severe.Shedenies new headache, thunderclap headache Aura:no Premonitory Phase:none Postdrome:fatigue Associated symptoms:Nausea, photophobia, phonophobia, osmophobia.Shedenies associated vomiting, visual disturbance,unilateral numbness or weakness. Duration:May be a day but may occur off and on for 3 to 5 days Frequency:Once a month Frequency of abortive medication:2 to 3 days a  week Triggers:Emotional stress, seasonal allergies, perfumes/scented candles Relieving factors:Closing eyes, rest Activity:Daily activity aggravates She does have an underlying dull daily headache.  Has seen neurology in the past. She has had brain MRI.  Past NSAIDS:none Past analgesics:Tylenol Past abortive triptans:Sumatriptan 50mg ; Maxalt 10mg , Tosymra (caused nausea) Past abortive ergotamine:none Past muscle relaxants:none Past anti-emetic:none Past antihypertensive medications:propranolol Past antidepressant medications:sertraline Past anticonvulsant medications:topiramate Past anti-CGRP:Aimovig 70mg  (constipation), Nurtec (rescue, reduced intensity but not abort, made her anxious), Reyvow (made her anxious Past vitamins/Herbal/Supplements:none Past antihistamines/decongestants:meclizine Other past therapies:none   Family history of headache:mom  PAST MEDICAL HISTORY: Past Medical History:  Diagnosis Date  . Allergic rhinitis   . Anal fistula   . Anxiety   . B12 deficiency   . Bipolar disorder in partial remission (HCC)    FOLLOWED BY PCP  . Depression   . Eczema   . Frequent headaches   . Hemorrhoids   . History of hypothyroidism    per pt hx yrs ago  . Hypertension   . Iron deficiency anemia   . Obesity   . OSA (obstructive sleep apnea)    per pt dx  prior to gastric bypass in 2008 wore cpap for awhile but has not used in yrs  . PONV (postoperative nausea and vomiting)    severe    MEDICATIONS: Current Outpatient Medications on File Prior to Visit  Medication Sig Dispense Refill  . diltiazem 2 % GEL Apply 1 application topically 3 (three) times daily. 1 Package 1  . QUEtiapine (SEROQUEL) 50 MG tablet Take 3 tablets by mouth daily. Is overdue for appointment. Please call the office to schedule. 270 tablet 0  . Rimegepant Sulfate (NURTEC) 75 MG TBDP Take 1 tablet by mouth as needed (Maximum 1 tablet in 24 hours.). 8  tablet 11  . scopolamine (TRANSDERM-SCOP, 1.5 MG,) 1 MG/3DAYS Place 1 patch (1.5 mg total) onto the skin every 3 (three) days. 10 patch 0  . tacrolimus (PROTOPIC) 0.1 % ointment Apply as directed    . triamcinolone cream (KENALOG) 0.1 % Apply as directed    . zolpidem (AMBIEN CR) 12.5 MG CR tablet Take 1 tablet (12.5 mg total) by mouth at bedtime as  needed. for sleep 90 tablet 0   No current facility-administered medications on file prior to visit.    ALLERGIES: No Known Allergies  FAMILY HISTORY: Family History  Problem Relation Age of Onset  . Mental illness Mother   . Diverticulitis Mother   . Alcohol abuse Father   . Mental illness Father   . Mental illness Maternal Aunt   . Mental illness Maternal Uncle   . Alcohol abuse Maternal Uncle   . Mental illness Maternal Grandmother   . Heart disease Maternal Grandmother   . Diabetes Maternal Grandmother   . Colon polyps Neg Hx   . Esophageal cancer Neg Hx   . Stomach cancer Neg Hx   . Rectal cancer Neg Hx     SOCIAL HISTORY: Social History   Socioeconomic History  . Marital status: Married    Spouse name: Not on file  . Number of children: 3  . Years of education: 39  . Highest education level: Not on file  Occupational History  . Occupation: self employed  Tobacco Use  . Smoking status: Former Smoker    Packs/day: 1.00    Years: 14.00    Pack years: 14.00    Types: Cigarettes    Quit date: 12/04/2019    Years since quitting: 1.1  . Smokeless tobacco: Never Used  Vaping Use  . Vaping Use: Never used  Substance and Sexual Activity  . Alcohol use: Yes    Comment: 1-2 daily  . Drug use: No  . Sexual activity: Yes    Birth control/protection: I.U.D.    Comment: Mirena IUD placed approx. 2016  Other Topics Concern  . Not on file  Social History Narrative  . Not on file   Social Determinants of Health   Financial Resource Strain: Not on file  Food Insecurity: Not on file  Transportation Needs: Not on file   Physical Activity: Not on file  Stress: Not on file  Social Connections: Not on file  Intimate Partner Violence: Not on file     Objective:  Blood pressure 135/84, pulse 90, height 5\' 5"  (1.651 m), weight 246 lb (111.6 kg), SpO2 96 %. General: No acute distress.  Patient appears well-groomed.     Assessment/Plan:   Chronic migraine without aura, without status migrainosus, not intractable - responding well to Botox  1.  Migraine prevention:  Botox 2.  Migraine rescue:  Advil for mild headaches.  Will have her try Ubrelvy.for severe headaches. 3.  Limit use of pain relievers to no more than 2 days out of week to prevent risk of rebound or medication-overuse headache. 4.  Keep headache diary 5.  Follow up for Botox  , DO  CC: Shon Millet, MD

## 2021-01-24 ENCOUNTER — Encounter: Payer: Self-pay | Admitting: Neurology

## 2021-01-24 ENCOUNTER — Ambulatory Visit (INDEPENDENT_AMBULATORY_CARE_PROVIDER_SITE_OTHER): Payer: No Typology Code available for payment source | Admitting: Neurology

## 2021-01-24 ENCOUNTER — Other Ambulatory Visit: Payer: Self-pay

## 2021-01-24 VITALS — BP 135/84 | HR 90 | Ht 65.0 in | Wt 246.0 lb

## 2021-01-24 DIAGNOSIS — G43709 Chronic migraine without aura, not intractable, without status migrainosus: Secondary | ICD-10-CM

## 2021-01-24 NOTE — Patient Instructions (Signed)
1.  Continue Botox 2.  At earliest onset of migraine, take Ubrelvy 100mg .  May repeat in 2 hours if needed.  Max 2 tablets in 24 hours.  If effective, contact me for script.   3.  Limit use of pain relievers to no more than 2 days out of week to prevent risk of rebound or medication-overuse headache. 4.  Follow up one year

## 2021-03-01 ENCOUNTER — Ambulatory Visit: Payer: No Typology Code available for payment source | Admitting: Neurology

## 2021-03-03 IMAGING — MR MR PELVIS WO/W CM
6 of 10 series · 28 of 48 positions shown · IV contrast (gadavist)
Comparison: None.

CLINICAL DATA: Rectal pain

EXAM:
MRI PELVIS WITHOUT AND WITH CONTRAST
TECHNIQUE: Multiplanar multisequence MR imaging of the pelvis was performed
both before and after administration of intravenous contrast.
CONTRAST:  10mL GADAVIST GADOBUTROL 1 MMOL/ML IV SOLN

[Series 2: T2 · sagittal · 2.5mm · 1.02mm/px · 6 of 47 slices shown (1 of 3)]
[im 1/47]
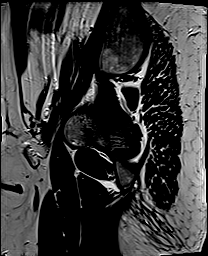
[im 10/47]
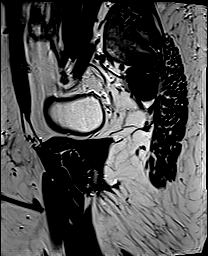
[im 19/47]
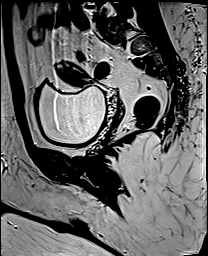
[im 28/47]
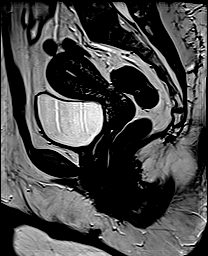
[im 37/47]
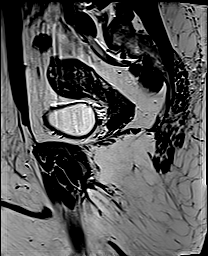
[im 47/47]
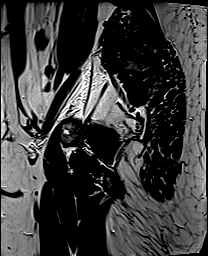

[Series 3: T2 fat-sat · sagittal · 2.5mm · 1.02mm/px · 6 of 47 slices shown (1 of 2)]
[im 1/47]
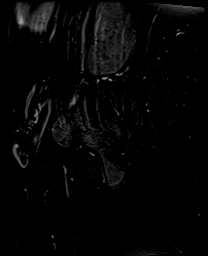
[im 10/47]
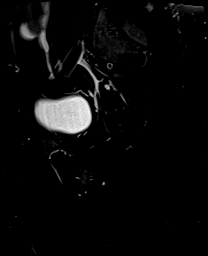
[im 19/47]
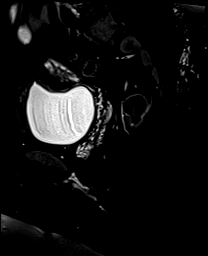
[im 28/47]
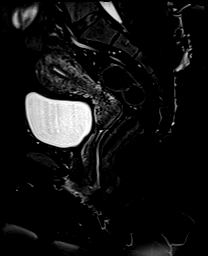
[im 37/47]
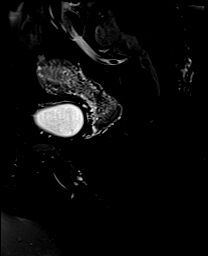
[im 47/47]
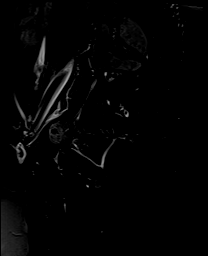

[Series 4: T2 · coronal · 5.0mm · 0.51mm/px · 4 of 32 slices shown (2 of 3)]
[im 1/32]
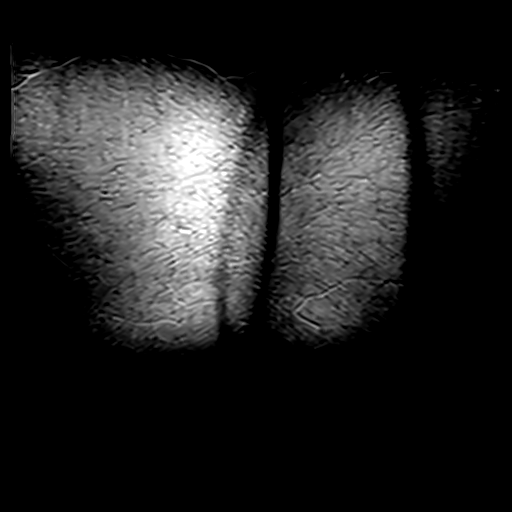
[im 11/32]
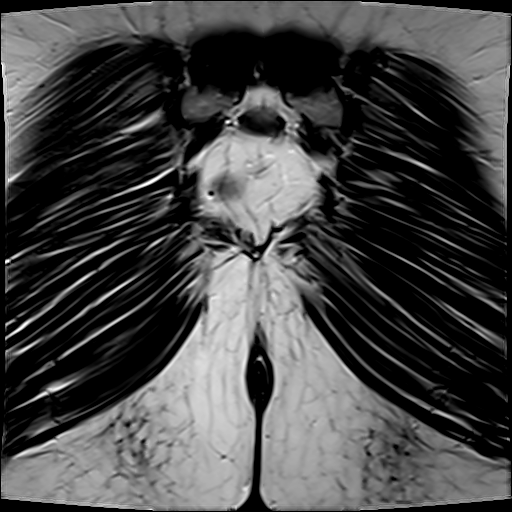
[im 21/32]
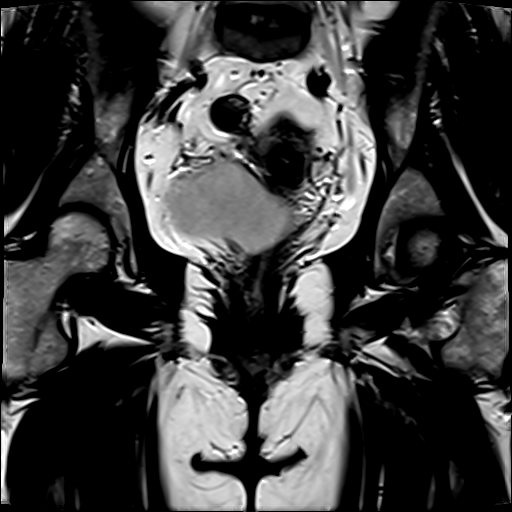
[im 32/32]
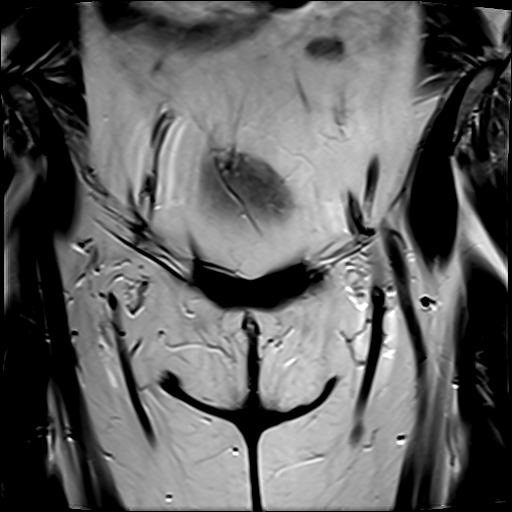

[Series 5: T1 · axial · 4.0mm · 0.43mm/px · z∈[-191,-36]mm · 5 of 34 slices shown]
[im 1/34]
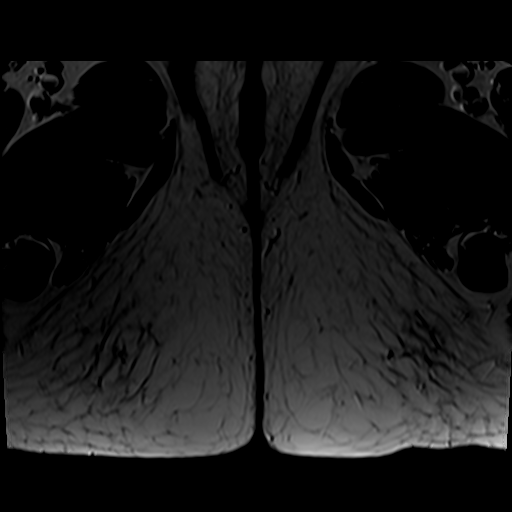
[im 9/34]
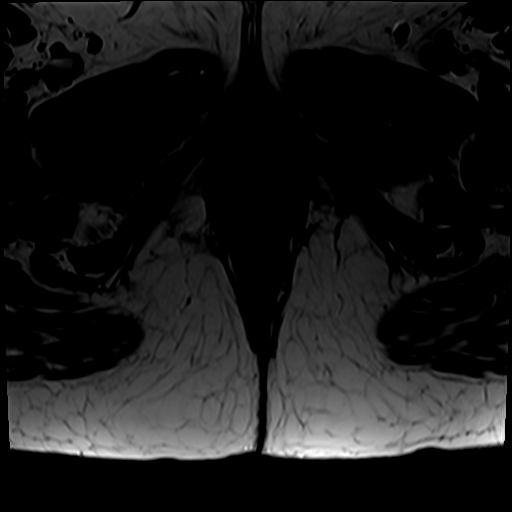
[im 17/34]
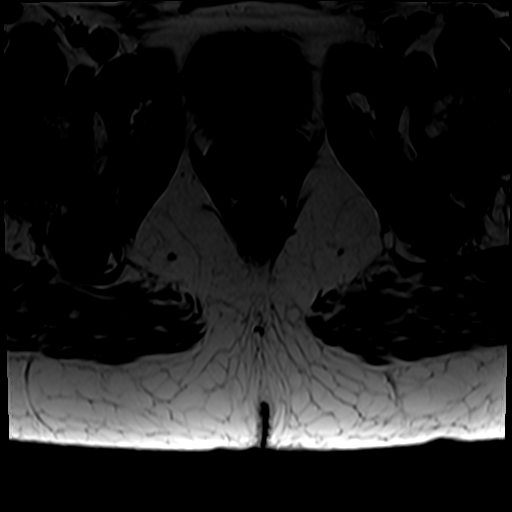
[im 25/34]
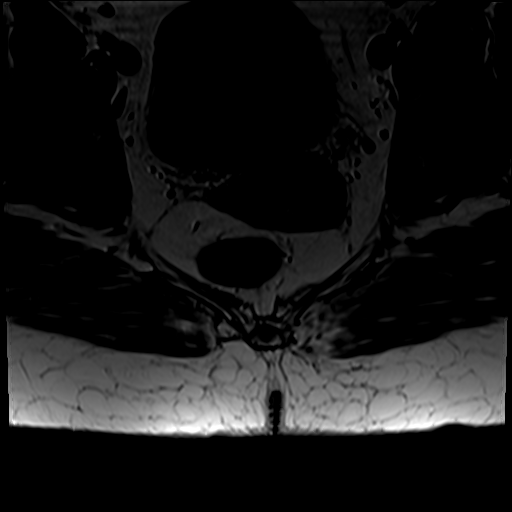
[im 34/34]
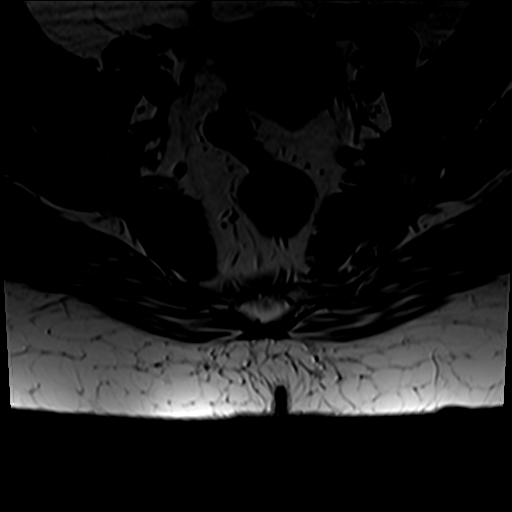

[Series 6: T2 · axial · 4.0mm · 0.43mm/px · z∈[-191,-36]mm · 5 of 34 slices shown (3 of 3)]
[im 1/34]
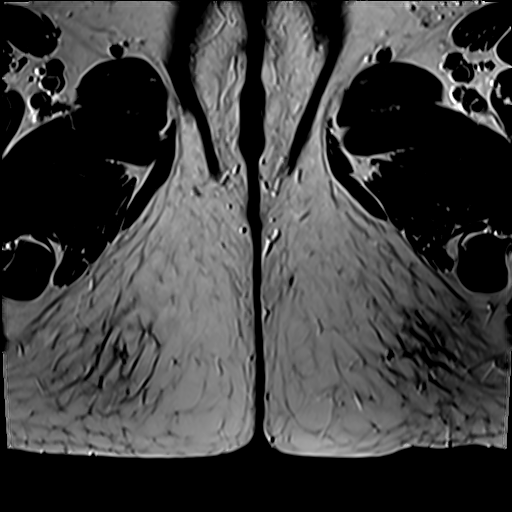
[im 9/34]
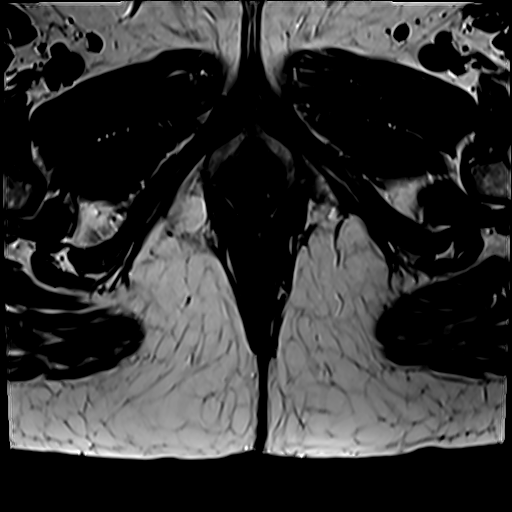
[im 17/34]
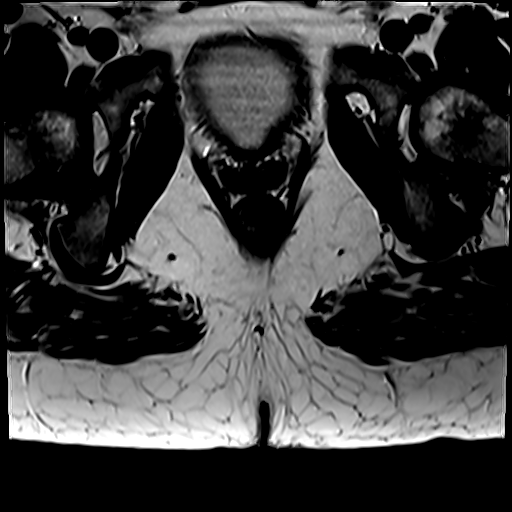
[im 25/34]
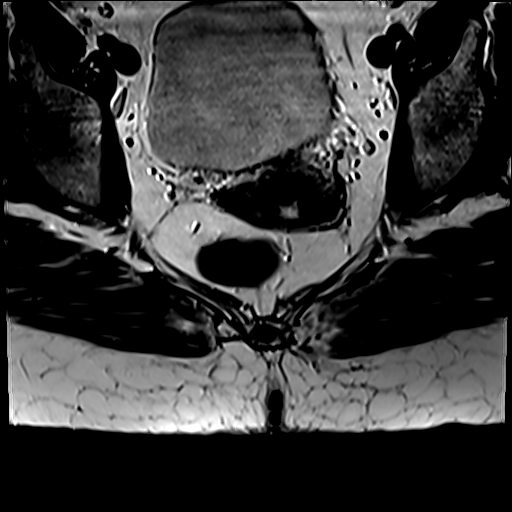
[im 34/34]
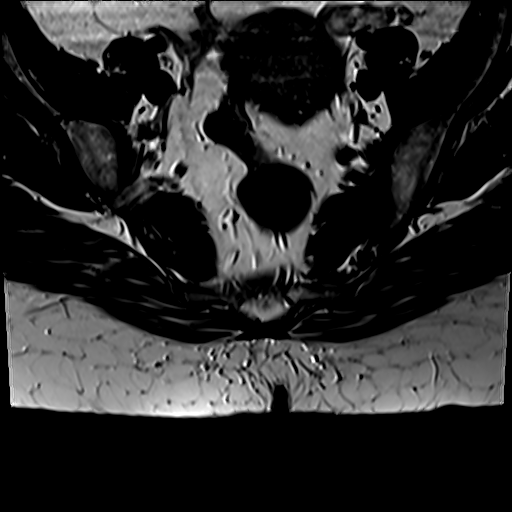

[Series 7: T2 fat-sat · axial · 4.0mm · 0.43mm/px · z∈[-191,-154]mm · 2 of 34 slices shown (2 of 2)]
[im 1/34]
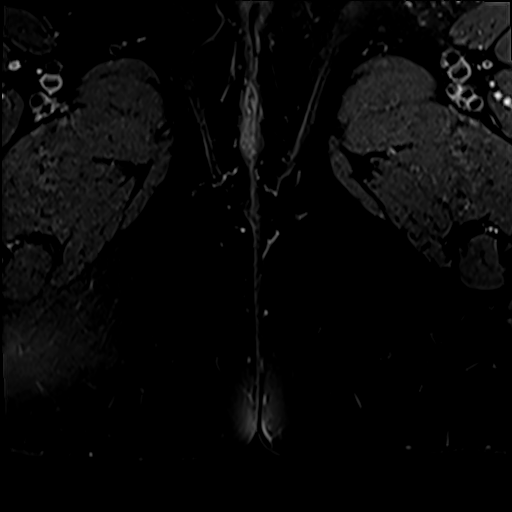
[im 9/34]
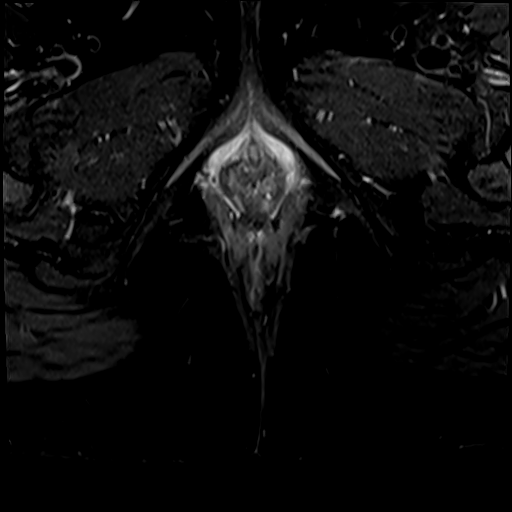

[28 of 48 positions shown; findings below may reference images not displayed]

FINDINGS: Urinary Tract:  No abnormality visualized.

Bowel:  Unremarkable visualized pelvic bowel loops.

Vascular/Lymphatic: No pathologically enlarged lymph nodes. No
significant vascular abnormality seen.

Reproductive:  No mass or other significant abnormality

Other:  None.

Musculoskeletal: No suspicious bone lesions identified.
IMPRESSION: No MR abnormality of the rectum or pelvis to explain pain with
voiding. No MR evidence of fissure, fistula, or abscess.

## 2021-03-22 ENCOUNTER — Other Ambulatory Visit: Payer: Self-pay

## 2021-03-22 ENCOUNTER — Ambulatory Visit (INDEPENDENT_AMBULATORY_CARE_PROVIDER_SITE_OTHER): Payer: No Typology Code available for payment source | Admitting: Neurology

## 2021-03-22 DIAGNOSIS — G43709 Chronic migraine without aura, not intractable, without status migrainosus: Secondary | ICD-10-CM

## 2021-03-22 MED ORDER — ONABOTULINUMTOXINA 100 UNITS IJ SOLR
155.0000 [IU] | Freq: Once | INTRAMUSCULAR | Status: AC
Start: 2021-03-22 — End: 2021-03-22
  Administered 2021-03-22: 155 [IU] via INTRAMUSCULAR

## 2021-03-22 NOTE — Progress Notes (Signed)
[facility]   Procedure Note Botox  Attending: Dr. Abdullah Rizzi  Preoperative Diagnosis(es): Chronic migraine  Consent obtained from: The patient Benefits discussed included, but were not limited to decreased muscle tightness, increased joint range of motion, and decreased pain.  Risk discussed included, but were not limited pain and discomfort, bleeding, bruising, excessive weakness, venous thrombosis, muscle atrophy and dysphagia.  Anticipated outcomes of the procedure as well as he risks and benefits of the alternatives to the procedure, and the roles and tasks of the personnel to be involved, were discussed with the patient, and the patient consents to the procedure and agrees to proceed. A copy of the patient medication guide was given to the patient which explains the blackbox warning.  Patients identity and treatment sites confirmed Yes.  .  Details of Procedure: Skin was cleaned with alcohol. Prior to injection, the needle plunger was aspirated to make sure the needle was not within a blood vessel.  There was no blood retrieved on aspiration.    Following is a summary of the muscles injected  And the amount of Botulinum toxin used:  Dilution 200 units of Botox was reconstituted with 4 ml of preservative free normal saline. Time of reconstitution: At the time of the office visit (<30 minutes prior to injection)   Injections  155 total units of Botox was injected with a 30 gauge needle.  Injection Sites: L occipitalis: 15 units- 3 sites  R occiptalis: 15 units- 3 sites  L upper trapezius: 15 units- 3 sites R upper trapezius: 15 units- 3 sits          L paraspinal: 10 units- 2 sites R paraspinal: 10 units- 2 sites  Face L frontalis(2 injection sites):10 units   R frontalis(2 injection sites):10 units         L corrugator: 5 units   R corrugator: 5 units           Procerus: 5 units   L temporalis: 20 units R temporalis: 20 units   Agent:  200 units of botulinum Type  A (Onobotulinum Toxin type A) was reconstituted with 4 ml of preservative free normal saline.  Time of reconstitution: At the time of the office visit (<30 minutes prior to injection)     Total injected (Units): 155  Total wasted (Units): none wasted  Patient tolerated procedure well without complications.   Reinjection is anticipated in 3 months.    

## 2021-03-28 ENCOUNTER — Telehealth: Payer: Self-pay | Admitting: Family Medicine

## 2021-03-28 NOTE — Telephone Encounter (Signed)
Patient did not bring her motion sickness patches on vacation with her and would like them called in to [facility] - [address], [place] [address]

## 2021-03-28 NOTE — Telephone Encounter (Signed)
Patient did not bring her motion sickness patches with her on vacation - she needs them sent to

## 2021-03-30 ENCOUNTER — Other Ambulatory Visit: Payer: Self-pay | Admitting: Family Medicine

## 2021-03-30 DIAGNOSIS — G4700 Insomnia, unspecified: Secondary | ICD-10-CM

## 2021-03-30 MED ORDER — ZOLPIDEM TARTRATE ER 12.5 MG PO TBCR: 12.5000 mg | EXTENDED_RELEASE_TABLET | Freq: Every evening | ORAL | 0 refills | Status: DC | PRN

## 2021-03-30 NOTE — Progress Notes (Signed)
Pt indicated she was at [employer] in [facility] and would be out of Ambien.  Prescription sent to pharmacy in [facility]

## 2021-04-01 ENCOUNTER — Other Ambulatory Visit: Payer: Self-pay

## 2021-04-01 DIAGNOSIS — H8110 Benign paroxysmal vertigo, unspecified ear: Secondary | ICD-10-CM

## 2021-04-01 MED ORDER — SCOPOLAMINE 1 MG/3DAYS TD PT72: 1.0000 | MEDICATED_PATCH | TRANSDERMAL | 0 refills | Status: DC

## 2021-04-01 NOTE — Telephone Encounter (Signed)
Rx filled and sent to patient pharmacy 

## 2021-04-05 ENCOUNTER — Telehealth: Payer: Self-pay | Admitting: Family Medicine

## 2021-04-05 NOTE — Telephone Encounter (Signed)
Pt called in stating that she thinks she has vertigo going on, she wanted to know if Dr. Beverely Low could send something in for her. Pt is aware that Dr. Beverely Low maybe already gone for the day and it will not be addressed until Monday.   Please advise

## 2021-04-08 ENCOUNTER — Other Ambulatory Visit: Payer: Self-pay | Admitting: Family Medicine

## 2021-04-08 ENCOUNTER — Other Ambulatory Visit: Payer: Self-pay

## 2021-04-08 MED ORDER — MECLIZINE HCL 25 MG PO TABS: 25.0000 mg | ORAL_TABLET | Freq: Three times a day (TID) | ORAL | 1 refills | Status: DC | PRN

## 2021-04-08 NOTE — Telephone Encounter (Signed)
We can send Meclizine 25mg  TID prn, #30, 1 refill.  Increase water intake.  Add daily Claritin/Zyrtec if not already taking.  Change positions slowly.  If not improving, will need OV

## 2021-04-08 NOTE — Telephone Encounter (Signed)
Patient is requesting a refill of the following medications: Requested Prescriptions   Pending Prescriptions Disp Refills  . QUEtiapine (SEROQUEL) 50 MG tablet [Pharmacy Med Name: QUETIAPINE FUMARATE 50 MG TAB] 270 tablet 0    Sig: TAKE 3 TABLETS BY MOUTH DAILY. IS OVERDUE FOR APPOINTMENT. PLEASE CALL THE OFFICE TO SCHEDULE.    Date of patient request: 04/08/2021  Last office visit: 01/01/2021 video visit. Date of last refill: 01/01/2021 Last refill amount:270 tablets  Follow up time period per chart: none.

## 2021-04-08 NOTE — Telephone Encounter (Signed)
Left a vm message with PCP recommendations. Sent medication to patient's pharmacy.

## 2021-06-12 ENCOUNTER — Encounter: Payer: Self-pay | Admitting: *Deleted

## 2021-06-18 ENCOUNTER — Telehealth: Payer: Self-pay | Admitting: Neurology

## 2021-06-18 NOTE — Telephone Encounter (Signed)
  Fax sent to the plan Your PA has been faxed to the plan as a paper copy. Please contact the plan directly if you haven't received a determination in a typical timeframe.  You will be notified of the determination via fax. Submitted pruor auth for botox 06/18/2021 via cover my meds  Current PA expired on 04/25/2021 for buy and bill   Waiting for reply

## 2021-06-21 ENCOUNTER — Ambulatory Visit: Payer: No Typology Code available for payment source | Admitting: Neurology

## 2021-06-21 ENCOUNTER — Other Ambulatory Visit: Payer: Self-pay

## 2021-06-21 DIAGNOSIS — G43709 Chronic migraine without aura, not intractable, without status migrainosus: Secondary | ICD-10-CM

## 2021-06-21 MED ORDER — ONABOTULINUMTOXINA 100 UNITS IJ SOLR
160.0000 [IU] | Freq: Once | INTRAMUSCULAR | Status: AC
  Administered 2021-06-21: 155 [IU] via INTRAMUSCULAR

## 2021-06-21 NOTE — Progress Notes (Signed)
[facility]  ° °Procedure Note Botox ° °Attending: Dr. Meyer Dockery ° °Preoperative Diagnosis(es): Chronic migraine ° °Consent obtained from: The patient °Benefits discussed included, but were not limited to decreased muscle tightness, increased joint range of motion, and decreased pain.  Risk discussed included, but were not limited pain and discomfort, bleeding, bruising, excessive weakness, venous thrombosis, muscle atrophy and dysphagia.  Anticipated outcomes of the procedure as well as he risks and benefits of the alternatives to the procedure, and the roles and tasks of the personnel to be involved, were discussed with the patient, and the patient consents to the procedure and agrees to proceed. A copy of the patient medication guide was given to the patient which explains the blackbox warning. ° °Patients identity and treatment sites confirmed Yes.  . ° °Details of Procedure: °Skin was cleaned with alcohol. Prior to injection, the needle plunger was aspirated to make sure the needle was not within a blood vessel.  There was no blood retrieved on aspiration.   ° °Following is a summary of the muscles injected  And the amount of Botulinum toxin used: ° °Dilution °200 units of Botox was reconstituted with 4 ml of preservative free normal saline. °Time of reconstitution: At the time of the office visit (<30 minutes prior to injection)  ° °Injections  °155 total units of Botox was injected with a 30 gauge needle. ° °Injection Sites: °L occipitalis: 15 units- 3 sites  °R occiptalis: 15 units- 3 sites ° °L upper trapezius: 15 units- 3 sites °R upper trapezius: 15 units- 3 sits          °L paraspinal: 10 units- 2 sites °R paraspinal: 10 units- 2 sites ° °Face °L frontalis(2 injection sites):10 units   °R frontalis(2 injection sites):10 units         °L corrugator: 5 units   °R corrugator: 5 units           °Procerus: 5 units   °L temporalis: 20 units °R temporalis: 20 units  ° °Agent:  °200 units of botulinum Type  A (Onobotulinum Toxin type A) was reconstituted with 4 ml of preservative free normal saline.  °Time of reconstitution: At the time of the office visit (<30 minutes prior to injection)  ° ° ° Total injected (Units):  155 ° Total wasted (Units):  5 ° °Patient tolerated procedure well without complications.   °Reinjection is anticipated in 3 months. ° ° °

## 2021-07-01 ENCOUNTER — Telehealth: Payer: Self-pay | Admitting: Family Medicine

## 2021-07-01 ENCOUNTER — Other Ambulatory Visit: Payer: Self-pay

## 2021-07-01 DIAGNOSIS — G4700 Insomnia, unspecified: Secondary | ICD-10-CM

## 2021-07-01 MED ORDER — ZOLPIDEM TARTRATE ER 12.5 MG PO TBCR: 12.5000 mg | EXTENDED_RELEASE_TABLET | Freq: Every evening | ORAL | 0 refills | Status: DC | PRN

## 2021-07-01 NOTE — Telephone Encounter (Signed)
Request sent to provider for approval.  

## 2021-07-01 NOTE — Telephone Encounter (Signed)
Medication Refills   Last OV:   Medication:  Zolpidum  Last filled 03/30/21 #90 with no refills Last visit 01/01/21 Next visit none

## 2021-07-01 NOTE — Telephone Encounter (Signed)
..  Medication Refills  Last OV:  Medication:  Zolpidum   Pharmacy:  [facility] [place]  Let patient know to contact pharmacy at the end of the day to make sure medication is ready.   Please notify patient to allow 48-72 hours to process.  Encourage patient to contact the pharmacy for refills or they can request refills through [facility]  Clinical Fills out below:   Last refill:  QTY:  Refill Date:    Other Comments:  Patient is completely out of medication   Okay for refill?  Please advise.

## 2021-07-05 ENCOUNTER — Other Ambulatory Visit: Payer: Self-pay | Admitting: Family Medicine

## 2021-07-05 NOTE — Telephone Encounter (Signed)
Patient is requesting a refill of the following medications: Requested Prescriptions   Pending Prescriptions Disp Refills   QUEtiapine (SEROQUEL) 50 MG tablet [Pharmacy Med Name: QUETIAPINE FUMARATE 50 MG TAB] 264 tablet 1    Sig: Take 3 tablets by mouth daily.    Date of patient request: 07/05/21 Last office visit: 01/01/21 Date of last refill: 04/08/21 Last refill amount: 270

## 2021-08-22 ENCOUNTER — Encounter: Payer: Self-pay | Admitting: Family Medicine

## 2021-08-22 ENCOUNTER — Telehealth (INDEPENDENT_AMBULATORY_CARE_PROVIDER_SITE_OTHER): Payer: No Typology Code available for payment source | Admitting: Family Medicine

## 2021-08-22 VITALS — Temp 100.0°F | Wt 246.0 lb

## 2021-08-22 DIAGNOSIS — U071 COVID-19: Secondary | ICD-10-CM

## 2021-08-22 MED ORDER — BENZONATATE 100 MG PO CAPS: 100.0000 mg | ORAL_CAPSULE | Freq: Two times a day (BID) | ORAL | 0 refills | Status: DC | PRN

## 2021-08-22 MED ORDER — MOLNUPIRAVIR EUA 200MG CAPSULE: 4.0000 | ORAL_CAPSULE | Freq: Two times a day (BID) | ORAL | 0 refills | Status: AC

## 2021-08-22 NOTE — Progress Notes (Signed)
Virtual Visit via Video Note  I connected with Monica Jefferson  on 08/22/21 at 12:00 PM EDT by a video enabled telemedicine application and verified that I am speaking with the correct person using two identifiers.  Location patient: home, [place] Location provider:work or home office Persons participating in the virtual visit: patient, provider  I discussed the limitations of evaluation and management by telemedicine and the availability of in person appointments. The patient expressed understanding and agreed to proceed.   HPI:  Acute telemedicine visit for Covid19: -Onset: yesterday, tested positive for covid this morning -Symptoms include: nasal congestion, cough, sinus discomfort, low grade temps around 100 -Denies: CP, SOB, NVD, inability eat/drink/get ouf of bed -Has tried:benadryl, ibuprofen -Pertinent past medical history: see below - obesity, HTN, depression -Pertinent medication allergies: No Known Allergies -COVID-19 vaccine status: 2 doses and no booster; had covid last year -no recent lab work -denies any chance of pregnancy   ROS: See pertinent positives and negatives per HPI.  Past Medical History:  Diagnosis Date   Allergic rhinitis    Anal fistula    Anxiety    B12 deficiency    Bipolar disorder in partial remission (HCC)    FOLLOWED BY PCP   Depression    Eczema    Frequent headaches    Hemorrhoids    History of hypothyroidism    per pt hx yrs ago   Hypertension    Iron deficiency anemia    Obesity    OSA (obstructive sleep apnea)    per pt dx  prior to gastric bypass in 2008 wore cpap for awhile but has not used in yrs   PONV (postoperative nausea and vomiting)    severe    Past Surgical History:  Procedure Laterality Date   EVALUATION UNDER ANESTHESIA WITH ANAL FISTULECTOMY N/A 08/14/2017   Procedure: ANAL EXAM UNDER ANESTHESIA;  Surgeon: Romie Levee, MD;  Location: [facility] [place];  Service: General;  Laterality: N/A;   FOOT SURGERY Bilateral  1990s   GASTRIC BYPASS  2008 approx.   LAPAROSCOPIC CHOLECYSTECTOMY  2007 approx   LAPAROSCOPIC GASTRIC BANDING  early 2000s   LEEP     LIGATION OF INTERNAL FISTULA TRACT N/A 01/21/2018   Procedure: LIGATION OF INTERNAL FISTULA TRACT;  Surgeon: Romie Levee, MD;  Location: [facility] [place];  Service: General;  Laterality: N/A;   PLACEMENT OF SETON N/A 08/14/2017   Procedure: PLACEMENT OF SETON;  Surgeon: Romie Levee, MD;  Location: [facility] [place];  Service: General;  Laterality: N/A;   PLACEMENT OF SETON N/A 10/09/2017   Procedure: PLACEMENT OF SETON;  Surgeon: Romie Levee, MD;  Location: [facility] [place];  Service: General;  Laterality: N/A;   TONSILLECTOMY AND ADENOIDECTOMY  child   WISDOM TOOTH EXTRACTION  teen   WRIST GANGLION EXCISION Right teen     Current Outpatient Medications:    benzonatate (TESSALON) 100 MG capsule, Take 1 capsule (100 mg total) by mouth 2 (two) times daily as needed for cough., Disp: 20 capsule, Rfl: 0   levonorgestrel (MIRENA) 20 MCG/DAY IUD, 1 each by Intrauterine route once., Disp: , Rfl:    meclizine (ANTIVERT) 25 MG tablet, Take 1 tablet (25 mg total) by mouth 3 (three) times daily as needed for dizziness., Disp: 30 tablet, Rfl: 1   molnupiravir EUA 200 mg CAPS, Take 4 capsules (800 mg total) by mouth 2 (two) times daily for 5 days., Disp: 40 capsule, Rfl: 0   QUEtiapine (SEROQUEL) 50 MG tablet,  TAKE 3 TABLETS BY MOUTH DAILY, Disp: 264 tablet, Rfl: 1   Rimegepant Sulfate (NURTEC) 75 MG TBDP, Take 1 tablet by mouth as needed (Maximum 1 tablet in 24 hours.)., Disp: 8 tablet, Rfl: 11   tacrolimus (PROTOPIC) 0.1 % ointment, Apply as directed, Disp: , Rfl:    triamcinolone cream (KENALOG) 0.1 %, Apply as directed, Disp: , Rfl:    zolpidem (AMBIEN CR) 12.5 MG CR tablet, Take 1 tablet (12.5 mg total) by mouth at bedtime as needed. for sleep, Disp: 90 tablet, Rfl: 0  EXAM:  VITALS per patient if applicable:  GENERAL:  alert, oriented, appears well and in no acute distress  HEENT: atraumatic, conjunttiva clear, no obvious abnormalities on inspection of external nose and ears  NECK: normal movements of the head and neck  LUNGS: on inspection no signs of respiratory distress, breathing rate appears normal, no obvious Jefferson SOB, gasping or wheezing  CV: no obvious cyanosis  MS: moves all visible extremities without noticeable abnormality  PSYCH/NEURO: pleasant and cooperative, no obvious depression or anxiety, speech and thought processing grossly intact  ASSESSMENT AND PLAN:  Discussed the following assessment and plan:  COVID-19   Discussed treatment options (infusions and oral options and risk of drug interactions), ideal treatment window, potential complications, isolation and precautions for COVID-19.  Discussed possibility of rebound with antivirals and the need to reisolate if it should occur for 5 days. Checked for/reviewed any labs done in the last 90 days with GFR listed in HPI if available. After lengthy discussion, the patient opted for treatment with molnupiravir due to being higher risk for complications of covid or severe disease and other factors. Discussed risk to developing fetus and back up protection. Discussed EUA status of this drug and the fact that there is preliminary limited knowledge of risks/interactions/side effects per EUA document vs possible benefits and precautions. This information was shared with patient during the visit and also was provided in patient instructions. Also, advised that patient discuss risks/interactions and use with pharmacist/treatment team as well. The patient did want a prescription for cough, Tessalon Rx sent.  Other symptomatic care measures summarized in patient instructions. Advised to seek prompt in person care if worsening, new symptoms arise, or if is not improving with treatment. Discussed options for inperson care if PCP office not available. Did let  this patient know that I only do telemedicine on Tuesdays and Thursdays for [place]. Advised to schedule follow up visit with PCP or [facility] if any further questions or concerns to avoid delays in care.   I discussed the assessment and treatment plan with the patient. The patient was provided an opportunity to ask questions and all were answered. The patient agreed with the plan and demonstrated an understanding of the instructions.     Terressa Koyanagi, DO

## 2021-08-22 NOTE — Patient Instructions (Signed)
HOME CARE TIPS:  -I sent the medication(s) we discussed to your pharmacy: Meds ordered this encounter  Medications   molnupiravir EUA 200 mg CAPS    Sig: Take 4 capsules (800 mg total) by mouth 2 (two) times daily for 5 days.    Dispense:  40 capsule    Refill:  0   benzonatate (TESSALON) 100 MG capsule    Sig: Take 1 capsule (100 mg total) by mouth 2 (two) times daily as needed for cough.    Dispense:  20 capsule    Refill:  0     -I sent in the [place] treatment or referral you requested per our discussion. Please see the information provided below and discuss further with the pharmacist/treatment team.   -If taking molnupiravir, there is a chance of rebound illness after finishing your treatment. If you become sick again please isolate for an additional 5 days.   -can use tylenol or aleve if needed for fevers, aches and pains per instructions  -can use nasal saline a few times per day if you have nasal congestion; sometimes  a short course of Afrin nasal spray for 3 days can help with symptoms as well  -stay hydrated, drink plenty of fluids and eat small healthy meals - avoid dairy  -can take 1000 IU (42mg) Vit D3 and 100-500 mg of Vit C daily per instructions  -If the Covid test is positive, check out the [facility] for more information on home care, transmission and treatment for COVID19  -follow up with your doctor in 2-3 days unless improving and feeling better  -stay home while sick, except to seek medical care. If you have COVID19, ideally it would be best to stay home for a full 10 days since the onset of symptoms PLUS one day of no fever and feeling better. Wear a good mask that fits snugly (such as N95 or KN95) if around others to reduce the risk of transmission.  It was nice to meet you today, and I really hope you are feeling better soon. I help [facility] out with telemedicine visits on Tuesdays and Thursdays and am available for visits on those days. If you have  any concerns or questions following this visit please schedule a follow up visit with your Primary Care doctor or seek care at a local urgent care clinic to avoid delays in care.    Seek in person care or schedule a follow up video visit promptly if your symptoms worsen, new concerns arise or you are not improving with treatment. Call 911 and/or seek emergency care if your symptoms are severe or life threatening.    Fact Sheet for Patients And Caregivers Emergency Use Authorization (EUA) Of LAGEVRIOT (molnupiravir) capsules For Coronavirus Disease 2019 (COVID-19)  What is the most important information I should know about LAGEVRIO? LAGEVRIO may cause serious side effects, including: ? LAGEVRIO may cause harm to your unborn baby. It is not known if LAGEVRIO will harm your baby if you take LAGEVRIO during pregnancy. o LAGEVRIO is not recommended for use in pregnancy. o LAGEVRIO has not been studied in pregnancy. LAGEVRIO was studied in pregnant animals only. When LAGEVRIO was given to pregnant animals, LAGEVRIO caused harm to their unborn babies. o You and your healthcare provider may decide that you should take LAGEVRIO during pregnancy if there are no other COVID-19 treatment options approved or authorized by the FDA that are accessible or clinically appropriate for you. o If you and your healthcare provider decide  that you should take LAGEVRIO during pregnancy, you and your healthcare provider should discuss the known and potential benefits and the potential risks of taking LAGEVRIO during pregnancy. For individuals who are able to become pregnant: ? You should use a reliable method of birth control (contraception) consistently and correctly during treatment with LAGEVRIO and for 4 days after the last dose of LAGEVRIO. Talk to your healthcare provider about reliable birth control methods. ? Before starting treatment with [facility] your healthcare provider may do a pregnancy test to see  if you are pregnant before starting treatment with LAGEVRIO. ? Tell your healthcare provider right away if you become pregnant or think you may be pregnant during treatment with LAGEVRIO. Pregnancy Surveillance Program: ? There is a pregnancy surveillance program for individuals who take LAGEVRIO during pregnancy. The purpose of this program is to collect information about the health of you and your baby. Talk to your healthcare provider about how to take part in this program. ? If you take LAGEVRIO during pregnancy and you agree to participate in the pregnancy surveillance program and allow your healthcare provider to share your information with [place], then your healthcare provider will report your use of [place] during pregnancy to [place]. by calling [phone] or PeacefulBlog.es. For individuals who are sexually active with partners who are able to become pregnant: ? It is not known if LAGEVRIO can affect sperm. While the risk is regarded as low, animal studies to fully assess the potential for LAGEVRIO to affect the babies of males treated with LAGEVRIO have not been completed. A reliable method of birth control (contraception) should be used consistently and correctly during treatment with LAGEVRIO and for at least 3 months after the last dose. The risk to sperm beyond 3 months is not known. Studies to understand the risk to sperm beyond 3 months are ongoing. Talk to your healthcare provider about reliable birth control methods. Talk to your healthcare provider if you have questions or concerns about how LAGEVRIO may affect sperm. You are being given this fact sheet because your healthcare provider believes it is necessary to provide you with LAGEVRIO for the treatment of adults with mild-to-moderate coronavirus disease 2019 (COVID-19) with positive results of direct SARS-CoV-2 viral testing, and who are at high risk for progression to  severe COVID-19 including hospitalization or death, and for whom other COVID-19 treatment options approved or authorized by the FDA are not accessible or clinically appropriate. The U.S. Food and Drug Administration (FDA) has issued an Emergency Use Authorization (EUA) to make LAGEVRIO available during the COVID-19 pandemic (for more details about an EUA please see "What is an Emergency Use Authorization?" at the end of this document). LAGEVRIO is not an FDA-approved medicine in the [place]. Read this Fact Sheet for information about LAGEVRIO. Talk to your healthcare provider about your options if you have any questions. It is your choice to take LAGEVRIO.  What is COVID-19? COVID-19 is caused by a virus called a coronavirus. You can get COVID-19 through close contact with another person who has the virus. COVID-19 illnesses have ranged from very mild-to-severe, including illness resulting in death. While information so far suggests that most COVID-19 illness is mild, serious illness can happen and may cause some of your other medical conditions to become worse. Older people and people of all ages with severe, long lasting (chronic) medical conditions like heart disease, lung disease and diabetes, for example seem to be at higher risk  of being hospitalized for COVID-19.  What is LAGEVRIO? LAGEVRIO is an investigational medicine used to treat mild-to-moderate COVID-19 in adults: ? with positive results of direct SARS-CoV-2 viral testing, and ? who are at high risk for progression to severe COVID-19 including hospitalization or death, and for whom other COVID-19 treatment options approved or authorized by the FDA are not accessible or clinically appropriate. The FDA has authorized the emergency use of LAGEVRIO for the treatment of mild-tomoderate COVID-19 in adults under an EUA. For more information on EUA, see the "What is an Emergency Use Authorization (EUA)?" section at the end of  this Fact Sheet. LAGEVRIO is not authorized: ? for use in people less than 18 years of age. ? for prevention of COVID-19. ? for people needing hospitalization for COVID-19. ? for use for longer than 5 consecutive days.  What should I tell my healthcare provider before I take LAGEVRIO? Tell your healthcare provider if you: ? Have any allergies ? Are breastfeeding or plan to breastfeed ? Have any serious illnesses ? Are taking any medicines (prescription, over-the-counter, vitamins, or herbal products).  How do I take LAGEVRIO? ? Take LAGEVRIO exactly as your healthcare provider tells you to take it. ? Take 4 capsules of LAGEVRIO every 12 hours (for example, at 8 am and at 8 pm) ? Take LAGEVRIO for 5 days. It is important that you complete the full 5 days of treatment with LAGEVRIO. Do not stop taking LAGEVRIO before you complete the full 5 days of treatment, even if you feel better. ? Take LAGEVRIO with or without food. ? You should stay in isolation for as long as your healthcare provider tells you to. Talk to your healthcare provider if you are not sure about how to properly isolate while you have COVID-19. ? Swallow LAGEVRIO capsules whole. Do not open, break, or crush the capsules. If you cannot swallow capsules whole, tell your healthcare provider. ? What to do if you miss a dose: o If it has been less than 10 hours since the missed dose, take it as soon as you remember o If it has been more than 10 hours since the missed dose, skip the missed dose and take your dose at the next scheduled time. ? Do not double the dose of LAGEVRIO to make up for a missed dose.  What are the important possible side effects of LAGEVRIO? ? See, "What is the most important information I should know about LAGEVRIO?" ? Allergic Reactions. Allergic reactions can happen in people taking LAGEVRIO, even after only 1 dose. Stop taking LAGEVRIO and call your healthcare provider right away if you get any  of the following symptoms of an allergic reaction: o hives o rapid heartbeat o trouble swallowing or breathing o swelling of the mouth, lips, or face o throat tightness o hoarseness o skin rash The most common side effects of LAGEVRIO are: ? diarrhea ? nausea ? dizziness These are not all the possible side effects of LAGEVRIO. Not many people have taken LAGEVRIO. Serious and unexpected side effects may happen. This medicine is still being studied, so it is possible that all of the risks are not known at this time.  What other treatment choices are there?  Veklury (remdesivir) is FDA-approved as an intravenous (IV) infusion for the treatment of mildto-moderate [id number]-87 in certain adults and children. Talk with your doctor to see if Marijean Heath is appropriate for you. Like LAGEVRIO, FDA may also allow for the emergency use of other medicines to  treat people with COVID-19. Go to LacrosseProperties.si for more information. It is your choice to be treated or not to be treated with LAGEVRIO. Should you decide not to take it, it will not change your standard medical care.  What if I am breastfeeding? Breastfeeding is not recommended during treatment with LAGEVRIO and for 4 days after the last dose of LAGEVRIO. If you are breastfeeding or plan to breastfeed, talk to your healthcare provider about your options and specific situation before taking LAGEVRIO.  How do I report side effects with LAGEVRIO? Contact your healthcare provider if you have any side effects that bother you or do not go away. Report side effects to FDA MedWatch at SmoothHits.hu or call 1-800-FDA-1088 (1- 7073202695).  How should I store [place]? ? Store LAGEVRIO capsules at room temperature between 79F to 30F (20C to 25C). ? Keep LAGEVRIO and all medicines out of the reach of children and pets. How can I  learn more about COVID-19? ? Ask your healthcare provider. ? Visit SeekRooms.co.uk ? Contact your local or state public health department. ? Call [place] at [phone] (toll free in the [place] ? Visit www.molnupiravir.com  What Is an Emergency Use Authorization (EUA)? The [place] FDA has made [place] available under an emergency access mechanism called an Emergency Use Authorization (EUA) The EUA is supported by a Presenter, broadcasting Health and Human Service (HHS) declaration that circumstances exist to justify emergency use of drugs and biological products during the COVID-19 pandemic. LAGEVRIO for the treatment of mild-to-moderate COVID-19 in adults with positive results of direct SARS-CoV-2 viral testing, who are at high risk for progression to severe COVID-19, including hospitalization or death, and for whom alternative COVID-19 treatment options approved or authorized by FDA are not accessible or clinically appropriate, has not undergone the same type of review as an FDA-approved product. In issuing an EUA under the BRKVT-55 public health emergency, the FDA has determined, among other things, that based on the total amount of scientific evidence available including data from adequate and well-controlled clinical trials, if available, it is reasonable to believe that the product may be effective for diagnosing, treating, or preventing COVID-19, or a serious or life-threatening disease or condition caused by COVID-19; that the known and potential benefits of the product, when used to diagnose, treat, or prevent such disease or condition, outweigh the known and potential risks of such product; and that there are no adequate, approved, and available alternatives.  All of these criteria must be met to allow for the product to be used in the treatment of patients during the COVID-19 pandemic. The EUA for LAGEVRIO is in effect for the duration of the COVID-19 declaration  justifying emergency use of LAGEVRIO, unless terminated or revoked (after which LAGEVRIO may no longer be used under the EUA). For patent information: http://rogers.info/ Copyright  2021-2022 [employer] [place], [place] and its affiliates. All rights reserved. [id number] Revised: March 2022

## 2021-09-06 ENCOUNTER — Telehealth: Payer: Self-pay

## 2021-09-06 NOTE — Telephone Encounter (Signed)
New message   Monica Jefferson (Key: [id number])Need help? Call us at [phone] Status Sent to [employer] Next Steps The plan will fax you a determination, typically within 1 to 5 business days.  How do I follow up? Drug Botox 200UNIT solution Form [employer] Specialty Botox Injectable Precertification Form Precertification for Botox Requests [phone] [phone]

## 2021-09-16 NOTE — Telephone Encounter (Signed)
F/u   Renew prior authorization   Shirley Friar (Key: [id number])Need help? Call us at [phone] Status Sent to [employer] Next Steps The plan will fax you a determination, typically within 1 to 5 business days.  How do I follow up? Drug Botox 200UNIT solution Form [employer] Specialty Botox Injectable Precertification Form Precertification for Botox Requests [phone] [phone]

## 2021-09-18 NOTE — Telephone Encounter (Signed)
F/u   The patient has an upcoming Botox injection appointment with Dr. Everlena Cooper on  09/20/21.  Call the patient at [phone] left voice mail prior Authorization is still pending appointment on 09/20/21 will be canceled.

## 2021-09-20 ENCOUNTER — Ambulatory Visit: Payer: No Typology Code available for payment source | Admitting: Neurology

## 2021-09-24 ENCOUNTER — Telehealth: Payer: Self-pay | Admitting: Neurology

## 2021-09-24 NOTE — Telephone Encounter (Signed)
Pt called in and left a message stating she was not approved for Botox injections. She would like to know what she should do now?

## 2021-09-25 NOTE — Telephone Encounter (Signed)
F/u   The patient called in to check on the prior authorization and advise status is still pending. The patient verbalized understanding and will wait for any updates.   Website [facility]otox[facility]   Benefit Verification [employer] Submitted!  For BV Basic submissions, please allow 24 hours for results.  For BV Full submissions, please allow 24-48 hours for results.

## 2021-09-26 ENCOUNTER — Other Ambulatory Visit: Payer: Self-pay | Admitting: Family Medicine

## 2021-09-26 DIAGNOSIS — G4700 Insomnia, unspecified: Secondary | ICD-10-CM

## 2021-09-27 ENCOUNTER — Other Ambulatory Visit: Payer: Self-pay

## 2021-09-27 DIAGNOSIS — G4700 Insomnia, unspecified: Secondary | ICD-10-CM

## 2021-09-27 MED ORDER — ZOLPIDEM TARTRATE ER 12.5 MG PO TBCR: 12.5000 mg | EXTENDED_RELEASE_TABLET | Freq: Every evening | ORAL | 0 refills | Status: DC | PRN

## 2021-09-27 NOTE — Telephone Encounter (Signed)
Patient is requesting a refill of the following medications: Requested Prescriptions   Pending Prescriptions Disp Refills   zolpidem (AMBIEN CR) 12.5 MG CR tablet [Pharmacy Med Name: ZOLPIDEM TART ER 12.5 MG TAB] 90 tablet 0    Sig: TAKE 1 TABLET (12.5 MG TOTAL) BY MOUTH AT BEDTIME AS NEEDED. FOR SLEEP    Date of patient request: 09/26/2021 Last office visit: 11/23/2019 Date of last refill: 07/01/2021 Last refill amount: 90 tablets  Follow up time period per chart:

## 2021-09-27 NOTE — Telephone Encounter (Signed)
Requesting:Ambien 12.5mg  Contract: UDS: Last Visit:01/01/21 Next Visit:n/a Last Refill:07/01/21 90 tabs 0 refills  Please Advise

## 2021-09-27 NOTE — Telephone Encounter (Signed)
Caller name:Hermine Suman   Caller callback [phone]  Encourage patient to contact the pharmacy for refills or they can request refills through [facility]  LAST APPOINTMENT DATE:  01/01/21 NEXT APPOINTMENT DATE: None  MEDICATION NAME & DOSE:zolpidem (AMBIEN CR) 12.5 MG CR tablet   Is the patient out of medication? Yes  [id number] [place], [place] - [address]   Let patient know to contact pharmacy at the end of the day to make sure medication is ready.  Please notify patient to allow 48-72 hours to process  (CLINICAL TO FILL OR ROUTE PER PROTOCOLS)

## 2021-10-09 NOTE — Telephone Encounter (Signed)
F/u   [employer] letter on file in media. Coverage for this services has been approved   Coverage decision for approved services Provider Dr. Everlena Cooper.   Services date  06/18/2021  to  06/17/2022    Procedure code  B9390  Services Description Botox

## 2021-10-16 ENCOUNTER — Telehealth: Payer: Self-pay

## 2021-10-16 NOTE — Telephone Encounter (Signed)
She needs an appt here tomorrow so we can update her tetanus shot and look at the wounds/start antibiotics.  She should definitely wash, peroxide, pat dry, and apply topical antibiotic ointment until we can see her.

## 2021-10-16 NOTE — Telephone Encounter (Signed)
Patient has been scheduled for tomorrow at 2:30

## 2021-10-16 NOTE — Telephone Encounter (Signed)
Patient last Tdap was 05/09/2011. Please advise

## 2021-10-16 NOTE — Telephone Encounter (Signed)
Caller name:Tomesha Mottola   On DPR? :yes  Call back number:[phone]  Provider they see: Beverely Low   Reason for call:Pt is calling asking for advice if she should seek medical attention kitten 91 weeks old bite scratched her from where she was playing with her and feeding her should she be concerned? Pt said she has small mark on her finger

## 2021-10-17 ENCOUNTER — Other Ambulatory Visit: Payer: Self-pay

## 2021-10-17 ENCOUNTER — Encounter: Payer: Self-pay | Admitting: Family Medicine

## 2021-10-17 ENCOUNTER — Ambulatory Visit: Payer: No Typology Code available for payment source | Admitting: Family Medicine

## 2021-10-17 VITALS — BP 122/78 | HR 69 | Temp 97.7°F | Resp 16 | Wt 239.6 lb

## 2021-10-17 DIAGNOSIS — W5501XA Bitten by cat, initial encounter: Secondary | ICD-10-CM

## 2021-10-17 DIAGNOSIS — Z23 Encounter for immunization: Secondary | ICD-10-CM | POA: Diagnosis not present

## 2021-10-17 DIAGNOSIS — S61451A Open bite of right hand, initial encounter: Secondary | ICD-10-CM | POA: Diagnosis not present

## 2021-10-17 DIAGNOSIS — S61452A Open bite of left hand, initial encounter: Secondary | ICD-10-CM

## 2021-10-17 MED ORDER — BOTOX 200 UNITS IJ SOLR: INTRAMUSCULAR | 4 refills | Status: DC

## 2021-10-17 MED ORDER — AMOXICILLIN-POT CLAVULANATE 875-125 MG PO TABS: 1.0000 | ORAL_TABLET | Freq: Two times a day (BID) | ORAL | 0 refills | Status: DC

## 2021-10-17 NOTE — Progress Notes (Signed)
Botox 200 units sent to [facility].

## 2021-10-17 NOTE — Progress Notes (Signed)
   Subjective:    Patient ID: Monica Jefferson, female    DOB: 1981-02-06, 40 y.o.   MRN: [id number]  HPI Cat bite- stray cat bit her on both L and R hands.  Cat is only 41 weeks old.  No signs of bites, wounds, or other infections.  No fevers/chills.  Cat is too young to be vaccinated.  No streaking redness, only mild discomfort on L index finger.  No visible pus or swelling.   Review of Systems For ROS see HPI   This visit occurred during the SARS-CoV-2 public health emergency.  Safety protocols were in place, including screening questions prior to the visit, additional usage of staff PPE, and extensive cleaning of exam room while observing appropriate contact time as indicated for disinfecting solutions.      Objective:   Physical Exam Vitals reviewed.  Constitutional:      General: She is not in acute distress.    Appearance: Normal appearance. She is obese. She is not ill-appearing.  Cardiovascular:     Pulses: Normal pulses.  Skin:    General: Skin is warm and dry.     Findings: Erythema (mild erythema surrounding linear bite on L index finger at DIP joint) present.  Neurological:     Mental Status: She is alert.          Assessment & Plan:   Cat bite- new.  Cat is newborn and while unvaccinated is in pt's home and can be observed.  Was taken in as a stray.  No current evidence of infxn but will start Augmentin b/c it's on the hand and will update Tdap.  Reviewed supportive care and red flags that should prompt return.  Pt expressed understanding and is in agreement w/ plan.

## 2021-10-17 NOTE — Patient Instructions (Signed)
Schedule your complete physical at your convenience START the Augmentin twice daily x7 days- take w/ food Monitor for redness, pus, swelling Call with any questions or concerns HAPPY BIRTHDAY!!!

## 2021-10-29 MED ORDER — BOTOX 200 UNITS IJ SOLR: INTRAMUSCULAR | 4 refills | Status: DC

## 2021-10-29 NOTE — Progress Notes (Signed)
Per Letter received please send script to [facility].  New script sent to [facility]

## 2021-10-29 NOTE — Addendum Note (Signed)
Addended by: Leida Lauth on: 10/29/2021 03:18 PM   Modules accepted: Orders

## 2021-11-05 ENCOUNTER — Telehealth: Payer: Self-pay

## 2021-11-05 NOTE — Telephone Encounter (Signed)
New message   [employer] approval letter to [facility] at [phone] benefit verification.

## 2021-12-17 NOTE — Telephone Encounter (Signed)
F/u   [place] via website status update   CHAT_ONC_JOLENE The Botox prescription is still processing at this time. It is in the final step of processing and should be available to schedule in the next 24 hours.  You Thank you, can I request a target date for delivery  CHAT_ONC_JOLENE Absolutely.  You Target date 12-19-21  CHAT_ONC_JOLENE Thank you. I have notes target date of 12/19/2021  You address [address], [place] [address] office hours 7:30am-4:30pm open during lunch

## 2021-12-18 ENCOUNTER — Other Ambulatory Visit (HOSPITAL_COMMUNITY): Payer: Self-pay

## 2021-12-18 NOTE — Telephone Encounter (Signed)
F/u  The patient benefit insurance is pending with [facility].   The patient in the past visit was a [employer] for 2022.  The patient has an upcoming appt with Dr. Everlena Cooper on 12/20/21.   The patient request a test claim to [facility] and spoke with [place], but not on the patient's formulary drug, which will need Prior Authorization.   Patient notified/informed/given [facility] phone number for a status update.   Informed the patient of [email or web address] [email or web address] which would cost up to $1,000 per treatment.

## 2021-12-19 ENCOUNTER — Telehealth: Payer: Self-pay | Admitting: Neurology

## 2021-12-19 NOTE — Telephone Encounter (Signed)
Patients appt for tomorrow has been canx. It stated pending on her appt line. It has been resch to 1/18. She said she would like to speak with someone about her botox. She wasn't sure if they were able to send it in or not. She knew they were having a hard time, so she wants to know if she is going to be able to have it on the 18th.

## 2021-12-20 ENCOUNTER — Ambulatory Visit: Payer: No Typology Code available for payment source | Admitting: Neurology

## 2021-12-20 NOTE — Telephone Encounter (Signed)
F/u   Received information from [facility]  BOTOX 200 UNIT SDV PWD 1/90cd needs pt permission to ship. start of new year will mean pt has deduct of 700.00 pt has no copay assist. pt phone showing [phone] if this is correct i will call pt.  The patient phone verified with [facility] rep.   I'm putting in the mail today for the patient Botox saving program information

## 2021-12-25 ENCOUNTER — Telehealth: Payer: Self-pay | Admitting: Neurology

## 2021-12-25 ENCOUNTER — Other Ambulatory Visit: Payer: Self-pay | Admitting: Family Medicine

## 2021-12-25 DIAGNOSIS — G4700 Insomnia, unspecified: Secondary | ICD-10-CM

## 2021-12-25 NOTE — Telephone Encounter (Signed)
Patient can not remember what it was like taking the [place].  Advised pt we have samples if she wants to try again.   If we send it to the pharmacy we can say if they will let her pick then or if it will need an approval as well.  Per pt she will try to see if she can come by. It is hard for her to get out of the bed.

## 2021-12-25 NOTE — Telephone Encounter (Signed)
Pt called in stating she is having a really bad migraine. All of her emergency medicines are expired and she doesn't have her Botox injection until 01/01/22. She is wondering if something could be called in for her? [facility] [place]

## 2022-01-01 ENCOUNTER — Other Ambulatory Visit: Payer: Self-pay | Admitting: Neurology

## 2022-01-01 ENCOUNTER — Other Ambulatory Visit: Payer: Self-pay

## 2022-01-01 ENCOUNTER — Ambulatory Visit: Payer: No Typology Code available for payment source | Admitting: Neurology

## 2022-01-01 DIAGNOSIS — G43709 Chronic migraine without aura, not intractable, without status migrainosus: Secondary | ICD-10-CM | POA: Diagnosis not present

## 2022-01-01 MED ORDER — ONABOTULINUMTOXINA 100 UNITS IJ SOLR
200.0000 [IU] | Freq: Once | INTRAMUSCULAR | Status: AC
  Administered 2022-01-01: 155 [IU] via INTRAMUSCULAR

## 2022-01-01 MED ORDER — UBRELVY 100 MG PO TABS: 1.0000 | ORAL_TABLET | ORAL | 5 refills | Status: DC | PRN

## 2022-01-01 NOTE — Progress Notes (Signed)
Send script for Roselyn Meier to My Script

## 2022-01-01 NOTE — Progress Notes (Signed)
Botulinum Clinic  ° °Procedure Note Botox ° °Attending: Dr. Ellie Bryand ° °Preoperative Diagnosis(es): Chronic migraine ° °Consent obtained from: The patient °Benefits discussed included, but were not limited to decreased muscle tightness, increased joint range of motion, and decreased pain.  Risk discussed included, but were not limited pain and discomfort, bleeding, bruising, excessive weakness, venous thrombosis, muscle atrophy and dysphagia.  Anticipated outcomes of the procedure as well as he risks and benefits of the alternatives to the procedure, and the roles and tasks of the personnel to be involved, were discussed with the patient, and the patient consents to the procedure and agrees to proceed. A copy of the patient medication guide was given to the patient which explains the blackbox warning. ° °Patients identity and treatment sites confirmed Yes.  . ° °Details of Procedure: °Skin was cleaned with alcohol. Prior to injection, the needle plunger was aspirated to make sure the needle was not within a blood vessel.  There was no blood retrieved on aspiration.   ° °Following is a summary of the muscles injected  And the amount of Botulinum toxin used: ° °Dilution °200 units of Botox was reconstituted with 4 ml of preservative free normal saline. °Time of reconstitution: At the time of the office visit (<30 minutes prior to injection)  ° °Injections  °155 total units of Botox was injected with a 30 gauge needle. ° °Injection Sites: °L occipitalis: 15 units- 3 sites  °R occiptalis: 15 units- 3 sites ° °L upper trapezius: 15 units- 3 sites °R upper trapezius: 15 units- 3 sits          °L paraspinal: 10 units- 2 sites °R paraspinal: 10 units- 2 sites ° °Face °L frontalis(2 injection sites):10 units   °R frontalis(2 injection sites):10 units         °L corrugator: 5 units   °R corrugator: 5 units           °Procerus: 5 units   °L temporalis: 20 units °R temporalis: 20 units  ° °Agent:  °200 units of botulinum Type  A (Onobotulinum Toxin type A) was reconstituted with 4 ml of preservative free normal saline.  °Time of reconstitution: At the time of the office visit (<30 minutes prior to injection)  ° ° ° Total injected (Units):  155 ° Total wasted (Units):  45 ° °Patient tolerated procedure well without complications.   °Reinjection is anticipated in 3 months. ° ° °

## 2022-01-01 NOTE — Progress Notes (Signed)
Medication Samples have been provided to the patient.  Drug name: Vernie Ammons       Strength: 75 mg        Qty: 4  LOT: [id number]  Exp.Date: 06/2022  Dosing instructions: as needed  The patient has been instructed regarding the correct time, dose, and frequency of taking this medication, including desired effects and most common side effects.   Leida Lauth 10:53 AM 01/01/2022

## 2022-01-02 ENCOUNTER — Other Ambulatory Visit: Payer: Self-pay | Admitting: Family Medicine

## 2022-01-27 ENCOUNTER — Ambulatory Visit: Payer: No Typology Code available for payment source | Admitting: Neurology

## 2022-02-18 ENCOUNTER — Ambulatory Visit (INDEPENDENT_AMBULATORY_CARE_PROVIDER_SITE_OTHER): Payer: No Typology Code available for payment source | Admitting: Family Medicine

## 2022-02-18 ENCOUNTER — Encounter: Payer: Self-pay | Admitting: Family Medicine

## 2022-02-18 VITALS — BP 138/82 | HR 74 | Temp 98.0°F | Resp 16 | Ht 64.5 in | Wt 238.2 lb

## 2022-02-18 DIAGNOSIS — N898 Other specified noninflammatory disorders of vagina: Secondary | ICD-10-CM

## 2022-02-18 DIAGNOSIS — Z0000 Encounter for general adult medical examination without abnormal findings: Secondary | ICD-10-CM

## 2022-02-18 DIAGNOSIS — E669 Obesity, unspecified: Secondary | ICD-10-CM | POA: Insufficient documentation

## 2022-02-18 LAB — CBC WITH DIFFERENTIAL/PLATELET
Basophils Absolute: 0 10*3/uL (ref 0.0–0.1)
Basophils Relative: 0.8 % (ref 0.0–3.0)
Eosinophils Absolute: 0 10*3/uL (ref 0.0–0.7)
Eosinophils Relative: 1.1 % (ref 0.0–5.0)
HCT: 33.6 % — ABNORMAL LOW (ref 36.0–46.0)
Hemoglobin: 10.9 g/dL — ABNORMAL LOW (ref 12.0–15.0)
Lymphocytes Relative: 28.6 % (ref 12.0–46.0)
Lymphs Abs: 1.3 10*3/uL (ref 0.7–4.0)
MCHC: 32.5 g/dL (ref 30.0–36.0)
MCV: 97.1 fl (ref 78.0–100.0)
Monocytes Absolute: 0.3 10*3/uL (ref 0.1–1.0)
Monocytes Relative: 7.4 % (ref 3.0–12.0)
Neutro Abs: 2.8 10*3/uL (ref 1.4–7.7)
Neutrophils Relative %: 62.1 % (ref 43.0–77.0)
Platelets: 264 10*3/uL (ref 150.0–400.0)
RBC: 3.46 Mil/uL — ABNORMAL LOW (ref 3.87–5.11)
RDW: 21.3 % — ABNORMAL HIGH (ref 11.5–15.5)
WBC: 4.6 10*3/uL (ref 4.0–10.5)

## 2022-02-18 LAB — LIPID PANEL
Cholesterol: 213 mg/dL — ABNORMAL HIGH (ref 0–200)
HDL: 77.4 mg/dL (ref >39.00)
LDL Cholesterol: 118 mg/dL — ABNORMAL HIGH (ref 0–99)
NonHDL: 135.54
Total CHOL/HDL Ratio: 3
Triglycerides: 90 mg/dL (ref 0.0–149.0)
VLDL: 18 mg/dL (ref 0.0–40.0)

## 2022-02-18 LAB — HEPATIC FUNCTION PANEL
ALT: 31 U/L (ref 0–35)
AST: 37 U/L (ref 0–37)
Albumin: 4.5 g/dL (ref 3.5–5.2)
Alkaline Phosphatase: 113 U/L (ref 39–117)
Bilirubin, Direct: 0.1 mg/dL (ref 0.0–0.3)
Total Bilirubin: 0.7 mg/dL (ref 0.2–1.2)
Total Protein: 7.2 g/dL (ref 6.0–8.3)

## 2022-02-18 LAB — BASIC METABOLIC PANEL
BUN: 11 mg/dL (ref 6–23)
CO2: 23 mEq/L (ref 19–32)
Calcium: 9.2 mg/dL (ref 8.4–10.5)
Chloride: 105 mEq/L (ref 96–112)
Creatinine, Ser: 0.76 mg/dL (ref 0.40–1.20)
GFR: 96.71 mL/min (ref >60.00)
Glucose, Bld: 83 mg/dL (ref 70–99)
Potassium: 4.3 mEq/L (ref 3.5–5.1)
Sodium: 136 mEq/L (ref 135–145)

## 2022-02-18 LAB — VITAMIN D 25 HYDROXY (VIT D DEFICIENCY, FRACTURES): VITD: 24.83 ng/mL — ABNORMAL LOW (ref 30.00–100.00)

## 2022-02-18 LAB — TSH: TSH: 2.92 u[IU]/mL (ref 0.35–5.50)

## 2022-02-18 MED ORDER — METRONIDAZOLE 500 MG PO TABS: 500.0000 mg | ORAL_TABLET | Freq: Two times a day (BID) | ORAL | 0 refills | Status: AC

## 2022-02-18 NOTE — Patient Instructions (Addendum)
Follow up in 1 year or as needed ?We'll notify you of your lab results and make any changes if needed ?We'll call you with your Nutrition appt ?Continue to work on healthy diet and regular exercise- you can do it! ?Take the Metronidazole starting the day you fly ?Call with any questions or concerns ?Stay Safe!  Stay Healthy!! ?

## 2022-02-18 NOTE — Assessment & Plan Note (Signed)
Ongoing issue for pt.  Will refer to nutritionist/counseling to improve eating habits and work towards a healthier lifestyle.  Check labs to risk stratify.  Will follow. ?

## 2022-02-18 NOTE — Assessment & Plan Note (Signed)
Pt's PE WNL w/ exception of obesity.  UTD on pap, mammo, Tdap.  Check labs.  Anticipatory guidance provided.  ?

## 2022-02-18 NOTE — Progress Notes (Signed)
? ?Subjective:  ? ? Patient ID: Monica Jefferson, female    DOB: October 22, 1981, 41 y.o.   MRN: [id number] ? ?HPI ?CPE- UTD on Tdap, pap, mammo.   ? ?Obesity- pt reports poor nutritional habits.  Isn't eating during the day but will eat prior to bed when she takes her Ambien.   ? ?Patient Care Team  ?  Relationship Specialty Notifications Start End  ?Sheliah Hatch, MD PCP - General Family Medicine  05/08/17   ?Arlana Lindau, NP Nurse Practitioner Obstetrics and Gynecology  07/29/17   ?Drema Dallas, DO Consulting Physician Neurology  11/29/19   ?  ? ?Health Maintenance  ?Topic Date Due  ? COVID-19 Vaccine (3 - Booster for Pfizer series) 03/06/2022 (Originally 06/22/2020)  ? INFLUENZA VACCINE  03/14/2022 (Originally 07/15/2021)  ? Hepatitis C Screening  02/19/2023 (Originally 10/18/1997)  ? HIV Screening  02/19/2023 (Originally 10/18/1994)  ? PAP SMEAR-Modifier  07/22/2024  ? TETANUS/TDAP  10/18/2031  ? HPV VACCINES  Aged Out  ?  ? ? ?Review of Systems ?Patient reports no vision/ hearing changes, adenopathy,fever, weight change,  persistant/recurrent hoarseness , swallowing issues, chest pain, palpitations, edema, persistant/recurrent cough, hemoptysis, dyspnea (rest/exertional/paroxysmal nocturnal), gastrointestinal bleeding (melena, rectal bleeding), abdominal pain, significant heartburn, bowel changes, GU symptoms (dysuria, hematuria, incontinence), syncope, focal weakness, memory loss, numbness & tingling, skin/nail changes, abnormal bruising or bleeding, anxiety, or depression.  ? ?Vaginal odor- pt reports this only occurs when she flies.  Takes a few days for the smell to go away.  Reports it's a fishy odor and doesn't smell it at any other time.  Has tried wipes, changing soaps, sprays- nothing works. ? ?+ thinning hair ? ?This visit occurred during the SARS-CoV-2 public health emergency.  Safety protocols were in place, including screening questions prior to the visit, additional usage of staff PPE, and extensive  cleaning of exam room while observing appropriate contact time as indicated for disinfecting solutions.   ?   ?Objective:  ? Physical Exam ?General Appearance:    Alert, cooperative, no distress, appears stated age, obese  ?Head:    Normocephalic, without obvious abnormality, atraumatic  ?Eyes:    PERRL, conjunctiva/corneas clear, EOM's intact, fundi  ?  benign, both eyes  ?Ears:    Normal TM's and external ear canals, both ears  ?Nose:   Deferred due to COVID  ?Throat:   ?Neck:   Supple, symmetrical, trachea midline, no adenopathy;  ?  Thyroid: no enlargement/tenderness/nodules  ?Back:     Symmetric, no curvature, ROM normal, no CVA tenderness  ?Lungs:     Clear to auscultation bilaterally, respirations unlabored  ?Chest Wall:    No tenderness or deformity  ? Heart:    Regular rate and rhythm, S1 and S2 normal, no murmur, rub ?  or gallop  ?Breast Exam:    Deferred to GYN  ?Abdomen:     Soft, non-tender, bowel sounds active all four quadrants,  ?  no masses, no organomegaly  ?Genitalia:    Deferred to GYN  ?Rectal:    ?Extremities:   Extremities normal, atraumatic, no cyanosis or edema  ?Pulses:   2+ and symmetric all extremities  ?Skin:   Skin color, texture, turgor normal, no rashes or lesions  ?Lymph nodes:   Cervical, supraclavicular, and axillary nodes normal  ?Neurologic:   CNII-XII intact, normal strength, sensation and reflexes  ?  throughout  ?  ? ? ? ?   ?Assessment & Plan:  ? ?Vaginal odor- new.  Pt  reports that every time she flies she develops a very strong, very foul vaginal odor.  This odor does not improve w/ showering or using OTC products.  Suspect she develops BV due to sweating and poor air circulation while sitting.  She has trip to [facility] upcoming, will provide Flagyl for her to start the day of her flight.  Pt expressed understanding and is in agreement w/ plan.  ?

## 2022-02-19 ENCOUNTER — Telehealth: Payer: Self-pay | Admitting: Family Medicine

## 2022-02-19 ENCOUNTER — Telehealth: Payer: Self-pay

## 2022-02-19 DIAGNOSIS — E559 Vitamin D deficiency, unspecified: Secondary | ICD-10-CM

## 2022-02-19 MED ORDER — VITAMIN D (ERGOCALCIFEROL) 1.25 MG (50000 UNIT) PO CAPS: 50000.0000 [IU] | ORAL_CAPSULE | ORAL | 0 refills | Status: DC

## 2022-02-19 NOTE — Telephone Encounter (Signed)
-----   Message from Sheliah Hatch, MD sent at 02/19/2022  7:29 AM EST ----- ?Labs look good w/ 2 mild exceptions- ? ?1) Vit D is low.  Based on this, we need to start prescription 50,000 units weekly x12 weeks in addition to daily OTC supplement of at least 2000 units.  ?2) hemoglobin (blood count) is mildly low.  Make sure you are taking a multivitamin w/ iron to improve this number ?

## 2022-02-19 NOTE — Telephone Encounter (Signed)
She needs a daily multivitamin w/ iron- like [employer]'s, [employer], or store brand equivalent.  This multivitamin will likely have Vit D in it, so she would only need 1,000 or 2,000 units of Vit D daily (in addition to the weekly prescription dose) ?

## 2022-02-19 NOTE — Telephone Encounter (Signed)
Patient aware of labs, viewed via my chart.  ?

## 2022-02-19 NOTE — Telephone Encounter (Signed)
Pt called in asking for a recommendation of what multivitamin with iron she should take and what Vit D she should be using.  ? ?[facility] the [facility] [place]  ?

## 2022-02-20 NOTE — Telephone Encounter (Signed)
Communicated via mychart

## 2022-03-24 ENCOUNTER — Other Ambulatory Visit: Payer: Self-pay | Admitting: Family Medicine

## 2022-03-24 DIAGNOSIS — G4700 Insomnia, unspecified: Secondary | ICD-10-CM

## 2022-03-25 NOTE — Telephone Encounter (Signed)
Patient is requesting a refill of the following medications: ?Requested Prescriptions  ? ?Pending Prescriptions Disp Refills  ? zolpidem (AMBIEN CR) 12.5 MG CR tablet [Pharmacy Med Name: ZOLPIDEM TART ER 12.5 MG TAB] 90 tablet 0  ?  Sig: TAKE 1 TABLET (12.5 MG TOTAL) BY MOUTH AT BEDTIME AS NEEDED. FOR SLEEP  ? ? ?Date of patient request: 03/25/22 ?Last office visit: 02/18/22 ?Date of last refill: 12/17/21 ?Last refill amount: 90 ? ?

## 2022-03-28 ENCOUNTER — Ambulatory Visit: Payer: No Typology Code available for payment source | Admitting: Neurology

## 2022-04-02 ENCOUNTER — Other Ambulatory Visit: Payer: Self-pay | Admitting: Family Medicine

## 2022-04-04 ENCOUNTER — Ambulatory Visit: Payer: No Typology Code available for payment source | Admitting: Neurology

## 2022-04-04 DIAGNOSIS — G43709 Chronic migraine without aura, not intractable, without status migrainosus: Secondary | ICD-10-CM | POA: Diagnosis not present

## 2022-04-04 MED ORDER — ONABOTULINUMTOXINA 100 UNITS IJ SOLR
200.0000 [IU] | Freq: Once | INTRAMUSCULAR | Status: AC
  Administered 2022-04-04: 155 [IU] via INTRAMUSCULAR

## 2022-04-04 MED ORDER — ONABOTULINUMTOXINA 100 UNITS IJ SOLR: 200.0000 [IU] | Freq: Once | INTRAMUSCULAR | Status: DC

## 2022-04-04 NOTE — Progress Notes (Signed)
Botulinum Clinic  ° °Procedure Note Botox ° °Attending: Dr. Aigner Horseman ° °Preoperative Diagnosis(es): Chronic migraine ° °Consent obtained from: The patient °Benefits discussed included, but were not limited to decreased muscle tightness, increased joint range of motion, and decreased pain.  Risk discussed included, but were not limited pain and discomfort, bleeding, bruising, excessive weakness, venous thrombosis, muscle atrophy and dysphagia.  Anticipated outcomes of the procedure as well as he risks and benefits of the alternatives to the procedure, and the roles and tasks of the personnel to be involved, were discussed with the patient, and the patient consents to the procedure and agrees to proceed. A copy of the patient medication guide was given to the patient which explains the blackbox warning. ° °Patients identity and treatment sites confirmed Yes.  . ° °Details of Procedure: °Skin was cleaned with alcohol. Prior to injection, the needle plunger was aspirated to make sure the needle was not within a blood vessel.  There was no blood retrieved on aspiration.   ° °Following is a summary of the muscles injected  And the amount of Botulinum toxin used: ° °Dilution °200 units of Botox was reconstituted with 4 ml of preservative free normal saline. °Time of reconstitution: At the time of the office visit (<30 minutes prior to injection)  ° °Injections  °155 total units of Botox was injected with a 30 gauge needle. ° °Injection Sites: °L occipitalis: 15 units- 3 sites  °R occiptalis: 15 units- 3 sites ° °L upper trapezius: 15 units- 3 sites °R upper trapezius: 15 units- 3 sits          °L paraspinal: 10 units- 2 sites °R paraspinal: 10 units- 2 sites ° °Face °L frontalis(2 injection sites):10 units   °R frontalis(2 injection sites):10 units         °L corrugator: 5 units   °R corrugator: 5 units           °Procerus: 5 units   °L temporalis: 20 units °R temporalis: 20 units  ° °Agent:  °200 units of botulinum Type  A (Onobotulinum Toxin type A) was reconstituted with 4 ml of preservative free normal saline.  °Time of reconstitution: At the time of the office visit (<30 minutes prior to injection)  ° ° ° Total injected (Units):  155 ° Total wasted (Units):  45 ° °Patient tolerated procedure well without complications.   °Reinjection is anticipated in 3 months. ° ° °

## 2022-04-28 ENCOUNTER — Ambulatory Visit: Payer: No Typology Code available for payment source | Admitting: Neurology

## 2022-05-13 NOTE — Progress Notes (Deleted)
NEUROLOGY FOLLOW UP OFFICE NOTE  Monica Jefferson [id number]  Assessment/Plan:   Migraine without aura, without status migrainosus, not intractable  Migraine prevention:  Botox Migraine rescue:  Advil Limit use of pain relievers to no more than 2 days out of week to prevent risk of rebound or medication-overuse headache. Keep headache diary Follow up ***   Subjective:  Monica Jefferson is a 41 year old female with Bipolar disorder, depression/anxiety who follows up for migraine.   UPDATE: Ubrelvy effective. Intensity:  dull Duration:  2 hours with Advil Frequency:  1 a week No severe migraines  Rescue protocol:  *** Current NSAIDS:  Advil (occasional, about once a week) Current analgesics:  none Current triptans:  none Current ergotamine:  none Current anti-emetic:  none Current muscle relaxants:  none Current anti-anxiolytic:  Valium PRN (for vertigo) Current sleep aide:  Ambien, Seroquel 150mg  at bedtime Current Antihypertensive medications:  none Current Antidepressant/antipsychotic medications:  Ambien, Seroquel 25mg  at bedtime Current Anticonvulsant medications:  none Current anti-CGRP:  Ubrelvy 100mg  Current Vitamins/Herbal/Supplements:  none Current Antihistamines/Decongestants:  Scopolamine patch; Flonase Other therapy:  Reyvow (rescue) Hormone/birth control:  IUD   Caffeine:  1 to 2 cans of Coke (usually caffeine-free) daily; No coffee.  Occasional frappe Smoker: 11 cigarettes a day.  Just started Chantix Diet:  2 Cokes a day. Exercise:  Not routine Depression:  stable; Anxiety:  yes Other pain:  Some back pain Sleep hygiene:  Poor.  She has insomnia.  She saw sleep medicine.  Sleep study did not demonstrate sleep apnea.     HISTORY:  Onset:  Childhood.   Location:  Left frontal, may become diffuse/face/periorbital, may radiate into neck Quality:  Dull ache progressing to throbbing Intensity:  severe.  She denies new headache, thunderclap  headache Aura:  no Premonitory Phase:  none Postdrome:  fatigue Associated symptoms:  Nausea, photophobia, phonophobia, osmophobia.  She denies associated vomiting, visual disturbance, unilateral numbness or weakness. Duration:  May be a day but may occur off and on for 3 to 5 days Frequency:  Once a month Frequency of abortive medication: 2 to 3 days a week Triggers:  Emotional stress, seasonal allergies, perfumes/scented candles Relieving factors:  Closing eyes, rest Activity:  Daily activity aggravates She does have an underlying dull daily headache.   Has seen neurology in the past.  She has had brain MRI.   Past NSAIDS:  none Past analgesics:  Tylenol Past abortive triptans:  Sumatriptan 50mg ; Maxalt 10mg , Tosymra (caused nausea) Past abortive ergotamine:  none Past muscle relaxants:  none Past anti-emetic:  none Past antihypertensive medications:  propranolol Past antidepressant medications:  sertraline Past anticonvulsant medications:  topiramate Past anti-CGRP:  Aimovig 70mg  (constipation), Nurtec (rescue, reduced intensity but not abort, made her anxious), Reyvow (made her anxious Past vitamins/Herbal/Supplements:  none Past antihistamines/decongestants:  meclizine Other past therapies:  none     Family history of headache:  mom  PAST MEDICAL HISTORY: Past Medical History:  Diagnosis Date   Allergic rhinitis    Anal fistula    Anxiety    B12 deficiency    Bipolar disorder in partial remission (HCC)    FOLLOWED BY PCP   Depression    Eczema    Frequent headaches    Hemorrhoids    History of hypothyroidism    per pt hx yrs ago   Hypertension    Iron deficiency anemia    Obesity    OSA (obstructive sleep apnea)    per pt  dx  prior to gastric bypass in 2008 wore cpap for awhile but has not used in yrs   PONV (postoperative nausea and vomiting)    severe    MEDICATIONS: Current Outpatient Medications on File Prior to Visit  Medication Sig Dispense Refill    Botulinum Toxin Type A (BOTOX) 200 units SOLR Inject 155 units IM into Multiple sites of the face, neck,head every 90 days 1 each 4   levonorgestrel (MIRENA) 20 MCG/DAY IUD 1 each by Intrauterine route once.     QUEtiapine (SEROQUEL) 50 MG tablet TAKE 3 TABLETS BY MOUTH DAILY 264 tablet 1   tacrolimus (PROTOPIC) 0.1 % ointment Apply as directed     triamcinolone cream (KENALOG) 0.1 % Apply as directed     Ubrogepant (UBRELVY) 100 MG TABS Take 1 tablet by mouth as needed (May repeat after 2 hours.  Maximum 2 tablets in 24 hours.). 16 tablet 5   Vitamin D, Ergocalciferol, (DRISDOL) 1.25 MG (50000 UNIT) CAPS capsule Take 1 capsule (50,000 Units total) by mouth every 7 (seven) days. 12 capsule 0   zolpidem (AMBIEN CR) 12.5 MG CR tablet TAKE 1 TABLET (12.5 MG TOTAL) BY MOUTH AT BEDTIME AS NEEDED. FOR SLEEP 90 tablet 0   No current facility-administered medications on file prior to visit.    ALLERGIES: No Known Allergies  FAMILY HISTORY: Family History  Problem Relation Age of Onset   Mental illness Mother    Diverticulitis Mother    Alcohol abuse Father    Mental illness Father    Mental illness Maternal Aunt    Mental illness Maternal Uncle    Alcohol abuse Maternal Uncle    Mental illness Maternal Grandmother    Heart disease Maternal Grandmother    Diabetes Maternal Grandmother    Colon polyps Neg Hx    Esophageal cancer Neg Hx    Stomach cancer Neg Hx    Rectal cancer Neg Hx       Objective:  *** General: No acute distress.  Patient appears ***-groomed.   Head:  Normocephalic/atraumatic Eyes:  Fundi examined but not visualized Neck: supple, no paraspinal tenderness, full range of motion Heart:  Regular rate and rhythm Lungs:  Clear to auscultation bilaterally Back: No paraspinal tenderness Neurological Exam: alert and oriented to person, place, and time.  Speech fluent and not dysarthric, language intact.  CN II-XII intact. Bulk and tone normal, muscle strength 5/5  throughout.  Sensation to light touch intact.  Deep tendon reflexes 2+ throughout, toes downgoing.  Finger to nose testing intact.  Gait normal, Romberg negative.   Monica Millet, DO  CC: ***

## 2022-05-14 ENCOUNTER — Encounter: Payer: Self-pay | Admitting: Family Medicine

## 2022-05-14 ENCOUNTER — Ambulatory Visit (INDEPENDENT_AMBULATORY_CARE_PROVIDER_SITE_OTHER): Payer: No Typology Code available for payment source | Admitting: Family Medicine

## 2022-05-14 ENCOUNTER — Ambulatory Visit: Payer: No Typology Code available for payment source | Admitting: Neurology

## 2022-05-14 VITALS — BP 126/75 | HR 71 | Temp 97.2°F | Ht 64.5 in | Wt 239.6 lb

## 2022-05-14 DIAGNOSIS — H8110 Benign paroxysmal vertigo, unspecified ear: Secondary | ICD-10-CM | POA: Diagnosis not present

## 2022-05-14 MED ORDER — ONDANSETRON HCL 4 MG PO TABS: 4.0000 mg | ORAL_TABLET | Freq: Three times a day (TID) | ORAL | 0 refills | Status: AC | PRN

## 2022-05-14 MED ORDER — DIAZEPAM 2 MG PO TABS: 2.0000 mg | ORAL_TABLET | Freq: Three times a day (TID) | ORAL | 0 refills | Status: DC | PRN

## 2022-05-14 MED ORDER — PREDNISONE 10 MG PO TABS: ORAL_TABLET | ORAL | 0 refills | Status: DC

## 2022-05-14 NOTE — Patient Instructions (Signed)
Follow up as needed or as scheduled START the Prednisone as directed to treat the vertigo Drink LOTS of fluids USE the Zofran as needed Take the Diazepam as needed for dizziness Continue to take your daily allergy medication Change positions slowly Call w/ any questions or concerns Hang in there!!!

## 2022-05-14 NOTE — Progress Notes (Signed)
   Subjective:    Patient ID: Monica Jefferson, female    DOB: Jul 24, 1981, 41 y.o.   MRN: [id number]  HPI Vertigo- sxs started while in [place].  Thought dizziness and nausea was due to medication and time zone differences but sxs persisted when she returned home.  This is now 2nd week of dizziness.  Sxs are improving but not resolved.  No longer having constant nausea.  Started Meclizine on Friday w/o relief.  Dizziness occurs w/ turning head, walking.  Able to lie down and sleep.  Pt reports this feels similar to previous episode.  Sxs started after flying to [place].  Taking daily allergy medication   Review of Systems For ROS see HPI     Objective:   Physical Exam Vitals reviewed.  Constitutional:      General: She is not in acute distress.    Appearance: Normal appearance. She is well-developed. She is obese. She is not ill-appearing.  HENT:     Head: Normocephalic and atraumatic.     Right Ear: Tympanic membrane is retracted.     Left Ear: Tympanic membrane and ear canal normal.     Mouth/Throat:     Pharynx: Uvula midline.  Eyes:     Conjunctiva/sclera: Conjunctivae normal.     Pupils: Pupils are equal, round, and reactive to light.     Comments: 2-3 beats of horizontal nystagmus  Cardiovascular:     Rate and Rhythm: Normal rate and regular rhythm.     Heart sounds: Normal heart sounds.  Pulmonary:     Effort: Pulmonary effort is normal. No respiratory distress.     Breath sounds: Normal breath sounds. No wheezing or rales.  Musculoskeletal:     Cervical back: Normal range of motion and neck supple.  Lymphadenopathy:     Cervical: No cervical adenopathy.  Skin:    General: Skin is warm and dry.  Neurological:     General: No focal deficit present.     Mental Status: She is alert and oriented to person, place, and time.     Cranial Nerves: No cranial nerve deficit.     Motor: No weakness.     Gait: Gait normal.     Deep Tendon Reflexes: Reflexes are normal and symmetric.   Psychiatric:        Mood and Affect: Mood normal.        Behavior: Behavior normal.        Thought Content: Thought content normal.        Judgment: Judgment normal.          Assessment & Plan:   Vertigo- new to provider.  Pt reports she has hx of similar.  In the past sxs have resolved w/ Diazepam.  Given her markedly retracted TM on R, will start Prednisone taper to improve eustachian tube dysfxn.  Zofran prn nausea.  Reviewed supportive care and red flags that should prompt return.  Pt expressed understanding and is in agreement w/ plan.

## 2022-05-22 ENCOUNTER — Telehealth: Payer: Self-pay | Admitting: Family Medicine

## 2022-05-22 ENCOUNTER — Telehealth: Payer: Self-pay

## 2022-05-22 NOTE — Telephone Encounter (Signed)
Patient called stating that she isn't feeling better. She thought she was but the she attempted to go grocery shopping and she got dizzy again. I let her know that we didn't have any appts with Dr Beverely Low until Monday and she wanted to know if there could be something called in for her again?

## 2022-05-22 NOTE — Telephone Encounter (Signed)
Spoke w/ pt and she states that she started feeling better and then yesterday she started feeling bad again . Taking the Diazepam . She said the dizziness is not going away. I am scheduling her an apt now to be seen .

## 2022-05-22 NOTE — Telephone Encounter (Signed)
So neither the Prednisone or the Diazepam have worked/are working?  If that's the case, we should refer her for a complete workup of the dizziness which would involve a neuro referral.  Just let me know how she wants to proceed

## 2022-05-22 NOTE — Progress Notes (Signed)
NEUROLOGY FOLLOW UP OFFICE NOTE  Monica Jefferson [id number]  Assessment/Plan:   1  Migraine without aura, without status migrainosus, not intractable -responding well to Botox 2  Benign paroxysmal positional vertigo  Migraine prevention:  Botox Migraine rescue:  Bernita RaisinUbrelvy 100mg  Limit use of pain relievers to no more than 2 days out of week to prevent risk of rebound or medication-overuse headache. Keep headache diary Refer to vestibular rehab.  Will refill diazepam which helped.    Subjective:  Monica Jefferson is a 41 year old female with Bipolar disorder, depression/anxiety who follows up for migraine.   UPDATEBernita Raisin: Ubrelvy?  There was a gap between July and January when she was unable to get Botox because her insurance was not approving it.  Since restarting Botox in January, she notes improvement.   Intensity:  dull Duration:  2 hours with Bernita RaisinUbrelvy Frequency:  no more than once a week She has some dull headaches that are treatable with Advil.  Over the past month, she has been experiencing vertigo.  It is mostly an undulating sensation but sometimes spinning.  Went to PCP.  Meclizine not effect.  Went to PCP and tried prescribed prednisone taper as well as diazepam and Zofran.   She did well for a few days but then symptoms seemed to get worse.  It is positional.  Moving around and looking around aggravates it.  It lasts a few seconds to a minute.  She has had this in the past.    Current NSAIDS:  Advil (occasional, less than once a week) Current analgesics:  none Current triptans:  none Current ergotamine:  none Current anti-emetic:  none Current muscle relaxants:  none Current anti-anxiolytic:  Valium PRN (for vertigo) Current sleep aide:  Ambien, Seroquel 150mg  at bedtime Current Antihypertensive medications:  none Current Antidepressant/antipsychotic medications:  Ambien, Seroquel 25mg  at bedtime Current Anticonvulsant medications:  none Current anti-CGRP:  none Current  Vitamins/Herbal/Supplements:  none Current Antihistamines/Decongestants:  Scopolamine patch; Flonase Other therapy:  Botox Hormone/birth control:  IUD   Caffeine:  1 to 2 cans of Coke (usually caffeine-free) daily; No coffee.  Occasional frappe Smoker: 11 cigarettes a day.  Just started Chantix Diet:  2 Cokes a day. Exercise:  Not routine Depression:  stable; Anxiety:  yes Other pain:  Some back pain Sleep hygiene:  Poor.  She has insomnia.  She saw sleep medicine.  Sleep study did not demonstrate sleep apnea.     HISTORY:  Onset:  Childhood.   Location:  Left frontal, may become diffuse/face/periorbital, may radiate into neck Quality:  Dull ache progressing to throbbing Intensity:  severe.  She denies new headache, thunderclap headache Aura:  no Premonitory Phase:  none Postdrome:  fatigue Associated symptoms:  Nausea, photophobia, phonophobia, osmophobia.  She denies associated vomiting, visual disturbance, unilateral numbness or weakness. Duration:  May be a day but may occur off and on for 3 to 5 days Frequency:  Once a month Frequency of abortive medication: 2 to 3 days a week Triggers:  Emotional stress, seasonal allergies, perfumes/scented candles Relieving factors:  Closing eyes, rest Activity:  Daily activity aggravates She does have an underlying dull daily headache.   Has seen neurology in the past.  She has had brain MRI.   Past NSAIDS:  none Past analgesics:  Tylenol Past abortive triptans:  Sumatriptan 50mg ; Maxalt 10mg , Tosymra (caused nausea) Past abortive ergotamine:  none Past muscle relaxants:  none Past anti-emetic:  none Past antihypertensive medications:  propranolol Past antidepressant  medications:  sertraline Past anticonvulsant medications:  topiramate Past anti-CGRP:  Aimovig 70mg  (constipation), Nurtec (rescue, reduced intensity but not abort, made her anxious), Reyvow (made her anxious Past vitamins/Herbal/Supplements:  none Past  antihistamines/decongestants:  meclizine Other past therapies:  Reyvow     Family history of headache:  mom  PAST MEDICAL HISTORY: Past Medical History:  Diagnosis Date   Allergic rhinitis    Anal fistula    Anxiety    B12 deficiency    Bipolar disorder in partial remission (HCC)    FOLLOWED BY PCP   Depression    Eczema    Frequent headaches    Hemorrhoids    History of hypothyroidism    per pt hx yrs ago   Hypertension    Iron deficiency anemia    Obesity    OSA (obstructive sleep apnea)    per pt dx  prior to gastric bypass in 2008 wore cpap for awhile but has not used in yrs   PONV (postoperative nausea and vomiting)    severe    MEDICATIONS: Current Outpatient Medications on File Prior to Visit  Medication Sig Dispense Refill   Biotin w/ Vitamins C & E (HAIR/SKIN/NAILS PO) Take by mouth.     Botulinum Toxin Type A (BOTOX) 200 units SOLR Inject 155 units IM into Multiple sites of the face, neck,head every 90 days 1 each 4   diazepam (VALIUM) 2 MG tablet Take 1 tablet (2 mg total) by mouth every 8 (eight) hours as needed (vertigo). 15 tablet 0   levonorgestrel (MIRENA) 20 MCG/DAY IUD 1 each by Intrauterine route once.     Multiple Vitamin (MULTIVITAMIN) tablet Take 1 tablet by mouth daily.     ondansetron (ZOFRAN) 4 MG tablet Take 1 tablet (4 mg total) by mouth every 8 (eight) hours as needed for nausea or vomiting. 30 tablet 0   predniSONE (DELTASONE) 10 MG tablet 3 tabs x3 days and then 2 tabs x3 days and then 1 tab x3 days.  Take w/ food. 18 tablet 0   QUEtiapine (SEROQUEL) 50 MG tablet TAKE 3 TABLETS BY MOUTH DAILY 264 tablet 1   tacrolimus (PROTOPIC) 0.1 % ointment Apply as directed     triamcinolone cream (KENALOG) 0.1 % Apply as directed     Ubrogepant (UBRELVY) 100 MG TABS Take 1 tablet by mouth as needed (May repeat after 2 hours.  Maximum 2 tablets in 24 hours.). 16 tablet 5   Vitamin D, Ergocalciferol, (DRISDOL) 1.25 MG (50000 UNIT) CAPS capsule Take 1  capsule (50,000 Units total) by mouth every 7 (seven) days. 12 capsule 0   zolpidem (AMBIEN CR) 12.5 MG CR tablet TAKE 1 TABLET (12.5 MG TOTAL) BY MOUTH AT BEDTIME AS NEEDED. FOR SLEEP 90 tablet 0   No current facility-administered medications on file prior to visit.    ALLERGIES: No Known Allergies  FAMILY HISTORY: Family History  Problem Relation Age of Onset   Mental illness Mother    Diverticulitis Mother    Alcohol abuse Father    Mental illness Father    Mental illness Maternal Aunt    Mental illness Maternal Uncle    Alcohol abuse Maternal Uncle    Mental illness Maternal Grandmother    Heart disease Maternal Grandmother    Diabetes Maternal Grandmother    Colon polyps Neg Hx    Esophageal cancer Neg Hx    Stomach cancer Neg Hx    Rectal cancer Neg Hx       Objective:  Blood pressure 140/83, pulse 77, height 5\' 4"  (1.626 m), weight 239 lb (108.4 kg), SpO2 98 %. General: No acute distress.  Patient appears well-groomed.   Head:  Normocephalic/atraumatic Eyes:  Fundi examined but not visualized Neck: supple, no paraspinal tenderness, full range of motion Heart:  Regular rate and rhythm Lungs:  Clear to auscultation bilaterally Back: No paraspinal tenderness Neurological Exam: alert and oriented to person, place, and time.  Speech fluent and not dysarthric, language intact.  CN II-XII intact. Bulk and tone normal, muscle strength 5/5 throughout.  Sensation to light touch intact.  Deep tendon reflexes 2+ throughout, toes downgoing.  Finger to nose testing intact.  Gait normal, Romberg negative.   , DO  CC: Shon Millet, MD

## 2022-05-23 ENCOUNTER — Encounter: Payer: Self-pay | Admitting: Neurology

## 2022-05-23 ENCOUNTER — Ambulatory Visit: Payer: No Typology Code available for payment source | Admitting: Neurology

## 2022-05-23 VITALS — BP 140/83 | HR 77 | Ht 64.0 in | Wt 239.0 lb

## 2022-05-23 DIAGNOSIS — G43009 Migraine without aura, not intractable, without status migrainosus: Secondary | ICD-10-CM

## 2022-05-23 DIAGNOSIS — H8110 Benign paroxysmal vertigo, unspecified ear: Secondary | ICD-10-CM

## 2022-05-23 MED ORDER — DIAZEPAM 2 MG PO TABS: 2.0000 mg | ORAL_TABLET | Freq: Three times a day (TID) | ORAL | 0 refills | Status: DC | PRN

## 2022-05-23 NOTE — Telephone Encounter (Signed)
Noted  

## 2022-05-23 NOTE — Patient Instructions (Addendum)
Refer to physical therapy for vestibular rehab Botox Diazepam as needed for vertigo, zofran for nausea Follow up for next Botox next month Schedule follow up appointment in office in one year for now.

## 2022-06-16 ENCOUNTER — Telehealth: Payer: Self-pay

## 2022-06-16 ENCOUNTER — Telehealth: Payer: Self-pay | Admitting: Neurology

## 2022-06-16 NOTE — Telephone Encounter (Signed)
Patient called and stated she has a botox appt July 21.  She is going to be out of town.. The next available with openings is September, she doesn't know if she can go that long in between visits.

## 2022-06-16 NOTE — Telephone Encounter (Signed)
Told patient that she would not at this time be able to find a sooner appointment due to her copay and insurance

## 2022-06-16 NOTE — Telephone Encounter (Signed)
Per Orlene Erm with [facility] at [facility], Thank you for waiting. the Botox is scheduled to ship on 07/10 for delivery on 07/11 to the address of [address] [place], [place] [address].

## 2022-06-23 ENCOUNTER — Other Ambulatory Visit: Payer: Self-pay | Admitting: Registered Nurse

## 2022-06-23 DIAGNOSIS — G4700 Insomnia, unspecified: Secondary | ICD-10-CM

## 2022-06-24 ENCOUNTER — Telehealth: Payer: Self-pay | Admitting: Family Medicine

## 2022-06-24 DIAGNOSIS — G4700 Insomnia, unspecified: Secondary | ICD-10-CM

## 2022-06-24 NOTE — Telephone Encounter (Signed)
Refilling on behalf of Dr. Beverely Low  Last visit for this in Jan 2022.  PDMP consulted, last refill on 03/27/22 for #90 with no refills.  Due for follow up with PCP who manages this medication  Will give temporary supply until she is able to follow up  Jari Sportsman, NP

## 2022-06-24 NOTE — Telephone Encounter (Signed)
Pt stating that her insurance doesn't cover 30 days supply on medications. Pt want to know  if she can get her zolpidem 12.5 mg changed to a 90 days supply.

## 2022-06-25 ENCOUNTER — Other Ambulatory Visit: Payer: Self-pay | Admitting: Family

## 2022-06-25 DIAGNOSIS — G4700 Insomnia, unspecified: Secondary | ICD-10-CM

## 2022-06-25 MED ORDER — ZOLPIDEM TARTRATE ER 12.5 MG PO TBCR: 12.5000 mg | EXTENDED_RELEASE_TABLET | Freq: Every evening | ORAL | 0 refills | Status: DC | PRN

## 2022-07-04 ENCOUNTER — Ambulatory Visit: Payer: No Typology Code available for payment source | Admitting: Neurology

## 2022-07-04 DIAGNOSIS — G43709 Chronic migraine without aura, not intractable, without status migrainosus: Secondary | ICD-10-CM | POA: Diagnosis not present

## 2022-07-04 MED ORDER — ONABOTULINUMTOXINA 200 UNITS IJ SOLR
200.0000 [IU] | Freq: Once | INTRAMUSCULAR | Status: AC
  Administered 2022-07-04: 155 [IU] via INTRAMUSCULAR

## 2022-07-04 NOTE — Progress Notes (Unsigned)
[facility]   Procedure Note Botox  Attending: Dr. Shon Millet  Preoperative Diagnosis(es): Chronic migraine  Consent obtained from: {PMR Consent:[id number]} Benefits discussed included, but were not limited to decreased muscle tightness, increased joint range of motion, and decreased pain.  Risk discussed included, but were not limited pain and discomfort, bleeding, bruising, excessive weakness, venous thrombosis, muscle atrophy and dysphagia.  Anticipated outcomes of the procedure as well as he risks and benefits of the alternatives to the procedure, and the roles and tasks of the personnel to be involved, were discussed with the patient, and the patient consents to the procedure and agrees to proceed. A copy of the patient medication guide was given to the patient which explains the blackbox warning.  Patients identity and treatment sites confirmed Yes.  .  Details of Procedure: Skin was cleaned with alcohol. Prior to injection, the needle plunger was aspirated to make sure the needle was not within a blood vessel.  There was no blood retrieved on aspiration.    Following is a summary of the muscles injected  And the amount of Botulinum toxin used:  Dilution 200 units of Botox was reconstituted with 4 ml of preservative free normal saline. Time of reconstitution: At the time of the office visit (<30 minutes prior to injection)   Injections  155 total units of Botox was injected with a 30 gauge needle.  Injection Sites: L occipitalis: 15 units- 3 sites  R occiptalis: 15 units- 3 sites  L upper trapezius: 15 units- 3 sites R upper trapezius: 15 units- 3 sits          L paraspinal: 10 units- 2 sites R paraspinal: 10 units- 2 sites  Face L frontalis(2 injection sites):10 units   R frontalis(2 injection sites):10 units         L corrugator: 5 units   R corrugator: 5 units           Procerus: 5 units   L temporalis: 20 units R temporalis: 20 units   Agent:  200 units of  botulinum Type A (Onobotulinum Toxin type A) was reconstituted with 4 ml of preservative free normal saline.  Time of reconstitution: At the time of the office visit (<30 minutes prior to injection)     Total injected (Units):  155  Total wasted (Units): ***  Patient tolerated procedure well without complications.   Reinjection is anticipated in 3 months.

## 2022-07-24 LAB — HM PAP SMEAR

## 2022-07-24 LAB — RESULTS CONSOLE HPV: CHL HPV: NEGATIVE

## 2022-08-05 ENCOUNTER — Telehealth: Payer: Self-pay | Admitting: Family Medicine

## 2022-08-05 MED ORDER — SCOPOLAMINE 1 MG/3DAYS TD PT72: 1.0000 | MEDICATED_PATCH | TRANSDERMAL | 1 refills | Status: DC

## 2022-08-05 NOTE — Telephone Encounter (Signed)
Caller name: Monica Jefferson   On DPR? :yes/no: Yes  Call back number: [phone]  Provider they see:  Beverely Low   Reason for call: pt states that she going on a  cruise and she need medication to prevent motion sickness.

## 2022-08-05 NOTE — Telephone Encounter (Signed)
Prescription for Scopolamine patch sent to pharmacy

## 2022-08-06 ENCOUNTER — Telehealth: Payer: Self-pay | Admitting: Neurology

## 2022-08-06 NOTE — Telephone Encounter (Signed)
Tusher from [facility] called requesting to speak with a clinical staff member about the Botox dates below.  Reference number: [id number]  06/21/21 and 07/04/22

## 2022-08-08 NOTE — Telephone Encounter (Signed)
06/21/21 buy and bill, 07/06/22, Patient supplied

## 2022-08-28 ENCOUNTER — Telehealth: Payer: Self-pay

## 2022-08-28 NOTE — Telephone Encounter (Signed)
PA needed for Botox 200 units 

## 2022-08-29 NOTE — Telephone Encounter (Signed)
Updated benefits requested from [employer]  Submitted a Prior Authorization request to [facility] for  Botox 200 unit  via CoverMyMeds. Will update once we receive a response.   Key: [id number] - PA Case ID: [id number]

## 2022-09-01 ENCOUNTER — Other Ambulatory Visit (HOSPITAL_COMMUNITY): Payer: Self-pay

## 2022-09-01 NOTE — Telephone Encounter (Addendum)
Pharmacy benefit:  Received notification from [facility] regarding a prior authorization for  Botox . Authorization has been APPROVED from 08/29/22 to 08/30/23.   Authorization # (Key: [id number]) - [id number]  Approval scanned into media tab   [employer] Buy/ Bill PA still pending- awaiting response  Key: [id number]

## 2022-09-01 NOTE — Telephone Encounter (Signed)
Medical Benefit: (Patient previously buy/bill)  Received notification from [facility] regarding a prior authorization for  Botox- [id number] . Authorization has been APPROVED from 10/07/22 to 10/07/23.   Buy/Bill  Authorization # [id number]

## 2022-09-22 ENCOUNTER — Other Ambulatory Visit: Payer: Self-pay | Admitting: Family Medicine

## 2022-09-23 ENCOUNTER — Telehealth: Payer: Self-pay | Admitting: Family Medicine

## 2022-09-23 ENCOUNTER — Other Ambulatory Visit: Payer: Self-pay | Admitting: Family

## 2022-09-23 DIAGNOSIS — G4700 Insomnia, unspecified: Secondary | ICD-10-CM

## 2022-09-23 NOTE — Telephone Encounter (Signed)
Encourage patient to contact the pharmacy for refills or they can request refills through [facility]  (Please schedule appointment if patient has not been seen in over a year)    WHAT [place] THIS SENT TO:  [facility] [address] [phone]  MEDICATION NAME & DOSE: zolpidem 12.5 mg  NOTES/COMMENTS FROM PATIENT: has med check appointment 10/01/22      Front office please notify patient: It takes 48-72 hours to process rx refill requests Ask patient to call pharmacy to ensure rx is ready before heading there.

## 2022-09-23 NOTE — Telephone Encounter (Signed)
Zolipdem 12.5 mg LOV: 05/14/22 Last Refill:06/25/22 Upcoming appt: 10/01/22

## 2022-09-24 MED ORDER — ZOLPIDEM TARTRATE ER 12.5 MG PO TBCR: 12.5000 mg | EXTENDED_RELEASE_TABLET | Freq: Every evening | ORAL | 0 refills | Status: DC | PRN

## 2022-09-24 NOTE — Telephone Encounter (Signed)
Called patient no answer, LM "your prescription has been sent to the pharmacy if you have further questions please contact our office at [phone] otherwise please contact your pharmacy"

## 2022-09-24 NOTE — Telephone Encounter (Signed)
Prescription sent

## 2022-10-01 ENCOUNTER — Ambulatory Visit: Payer: No Typology Code available for payment source | Admitting: Family Medicine

## 2022-10-03 ENCOUNTER — Ambulatory Visit: Payer: No Typology Code available for payment source | Admitting: Neurology

## 2022-10-08 ENCOUNTER — Encounter: Payer: Self-pay | Admitting: Neurology

## 2022-10-08 ENCOUNTER — Ambulatory Visit: Payer: No Typology Code available for payment source | Admitting: Neurology

## 2022-10-08 DIAGNOSIS — Z029 Encounter for administrative examinations, unspecified: Secondary | ICD-10-CM

## 2022-10-14 ENCOUNTER — Ambulatory Visit: Payer: No Typology Code available for payment source | Admitting: Neurology

## 2022-10-14 DIAGNOSIS — G43709 Chronic migraine without aura, not intractable, without status migrainosus: Secondary | ICD-10-CM | POA: Diagnosis not present

## 2022-10-14 MED ORDER — ONABOTULINUMTOXINA 100 UNITS IJ SOLR
200.0000 [IU] | Freq: Once | INTRAMUSCULAR | Status: AC
  Administered 2022-10-14: 155 [IU] via INTRAMUSCULAR

## 2022-10-14 NOTE — Progress Notes (Signed)
[facility]  ° °Procedure Note Botox ° °Attending: Dr. Sontee Desena ° °Preoperative Diagnosis(es): Chronic migraine ° °Consent obtained from: The patient °Benefits discussed included, but were not limited to decreased muscle tightness, increased joint range of motion, and decreased pain.  Risk discussed included, but were not limited pain and discomfort, bleeding, bruising, excessive weakness, venous thrombosis, muscle atrophy and dysphagia.  Anticipated outcomes of the procedure as well as he risks and benefits of the alternatives to the procedure, and the roles and tasks of the personnel to be involved, were discussed with the patient, and the patient consents to the procedure and agrees to proceed. A copy of the patient medication guide was given to the patient which explains the blackbox warning. ° °Patients identity and treatment sites confirmed Yes.  . ° °Details of Procedure: °Skin was cleaned with alcohol. Prior to injection, the needle plunger was aspirated to make sure the needle was not within a blood vessel.  There was no blood retrieved on aspiration.   ° °Following is a summary of the muscles injected  And the amount of Botulinum toxin used: ° °Dilution °200 units of Botox was reconstituted with 4 ml of preservative free normal saline. °Time of reconstitution: At the time of the office visit (<30 minutes prior to injection)  ° °Injections  °155 total units of Botox was injected with a 30 gauge needle. ° °Injection Sites: °L occipitalis: 15 units- 3 sites  °R occiptalis: 15 units- 3 sites ° °L upper trapezius: 15 units- 3 sites °R upper trapezius: 15 units- 3 sits          °L paraspinal: 10 units- 2 sites °R paraspinal: 10 units- 2 sites ° °Face °L frontalis(2 injection sites):10 units   °R frontalis(2 injection sites):10 units         °L corrugator: 5 units   °R corrugator: 5 units           °Procerus: 5 units   °L temporalis: 20 units °R temporalis: 20 units  ° °Agent:  °200 units of botulinum Type  A (Onobotulinum Toxin type A) was reconstituted with 4 ml of preservative free normal saline.  °Time of reconstitution: At the time of the office visit (<30 minutes prior to injection)  ° ° ° Total injected (Units):  155 ° Total wasted (Units):  45 ° °Patient tolerated procedure well without complications.   °Reinjection is anticipated in 3 months. ° ° °

## 2022-10-16 NOTE — Telephone Encounter (Signed)
Close this please  

## 2022-11-24 ENCOUNTER — Ambulatory Visit (INDEPENDENT_AMBULATORY_CARE_PROVIDER_SITE_OTHER): Payer: No Typology Code available for payment source | Admitting: Family Medicine

## 2022-11-24 ENCOUNTER — Encounter: Payer: Self-pay | Admitting: Family Medicine

## 2022-11-24 VITALS — BP 122/82 | HR 85 | Temp 98.4°F | Resp 18 | Ht 64.0 in | Wt 242.5 lb

## 2022-11-24 DIAGNOSIS — F32A Depression, unspecified: Secondary | ICD-10-CM | POA: Diagnosis not present

## 2022-11-24 DIAGNOSIS — K219 Gastro-esophageal reflux disease without esophagitis: Secondary | ICD-10-CM

## 2022-11-24 DIAGNOSIS — G4700 Insomnia, unspecified: Secondary | ICD-10-CM | POA: Diagnosis not present

## 2022-11-24 DIAGNOSIS — F419 Anxiety disorder, unspecified: Secondary | ICD-10-CM

## 2022-11-24 MED ORDER — BUPROPION HCL 75 MG PO TABS
75.0000 mg | ORAL_TABLET | Freq: Two times a day (BID) | ORAL | 3 refills | Status: DC
Start: 2022-11-24 — End: 2022-12-29

## 2022-11-24 MED ORDER — PANTOPRAZOLE SODIUM 40 MG PO TBEC: 40.0000 mg | DELAYED_RELEASE_TABLET | Freq: Every day | ORAL | 3 refills | Status: DC

## 2022-11-24 NOTE — Progress Notes (Signed)
   Subjective:    Patient ID: Monica Jefferson, female    DOB: 04-16-1981, 41 y.o.   MRN: [id number]  HPI Insomnia- chronic problem, on Ambien CR 12.5mg  and Seroquel 50mg .  She says she can fall asleep but having trouble staying asleep.  Pt will keep her eye mask on at night to avoid knowing what time it is.  Pt reports feeling tired upon waking.  Sxs are more common when she has something to do the next day.  Unable to turn off brain.  Pt reports increased anxiety recently.  Finds herself tearful.  Pt has been on Lexapro, Trintellix, and Zoloft.  Zoloft did not work for her and put her in 'a bad place for a long time'.    'i think I have GERD'- got COVID in August but the cough and hoarseness persisted.  Is going through Tums and Pepto 'like crazy'.  Tried OTC Omeprazole, Lansoprazole.     Review of Systems For ROS see HPI     Objective:   Physical Exam Vitals reviewed.  Constitutional:      General: She is not in acute distress.    Appearance: Normal appearance. She is obese. She is not ill-appearing.  HENT:     Head: Normocephalic and atraumatic.  Pulmonary:     Effort: Pulmonary effort is normal. No respiratory distress.  Skin:    General: Skin is warm and dry.  Neurological:     General: No focal deficit present.     Mental Status: She is alert and oriented to person, place, and time.  Psychiatric:        Mood and Affect: Mood normal.        Behavior: Behavior normal.        Thought Content: Thought content normal.           Assessment & Plan:

## 2022-11-24 NOTE — Patient Instructions (Signed)
Follow up in 4-5 weeks to recheck mood and sleep START the Wellbutrin twice daily Continue the Ambien and Seroquel nightly Call with any questions or concerns Hang in there!!! Happy Holidays!!!

## 2022-12-23 ENCOUNTER — Other Ambulatory Visit: Payer: Self-pay | Admitting: Family Medicine

## 2022-12-23 DIAGNOSIS — G4700 Insomnia, unspecified: Secondary | ICD-10-CM

## 2022-12-23 NOTE — Telephone Encounter (Signed)
Ambien 12.5 mg LOV: 11/24/22 Last Refill:09/24/22 Upcoming appt: no apt

## 2022-12-24 ENCOUNTER — Other Ambulatory Visit: Payer: Self-pay | Admitting: Family Medicine

## 2022-12-24 DIAGNOSIS — G4700 Insomnia, unspecified: Secondary | ICD-10-CM

## 2022-12-24 MED ORDER — ZOLPIDEM TARTRATE ER 12.5 MG PO TBCR: 12.5000 mg | EXTENDED_RELEASE_TABLET | Freq: Every evening | ORAL | 0 refills | Status: DC | PRN

## 2022-12-24 NOTE — Telephone Encounter (Signed)
Notified pt her Rx was sent in

## 2022-12-24 NOTE — Progress Notes (Signed)
Chat stared with [facility] for delivery,   Unable to Schedule via MAP Hello! A representative will be with you shortly. [name] has joined the conversation. You Botox 200 units You Metta [place], [address] [id number]. M-F 7:30-4pm. [name] Good morning Monica Jefferson my name is [name] I am happy to help, may I have your title and direct callback number? You CMA, [phone], Monica Jefferson [birth year] [place] Thank you, please allow me 2-3 minutes to review the account. You ok [name] The rx and prior auth approval have both expired. Please obtain a new prior auth from insurance and send an [place], fax too [phone], I can call you and transfer you over to the verbal line or you may call [phone] directly to give a new verbal rx. You Medical Benefit: (Patient previously buy/bill) Received notification from [facility] regarding a prior authorization for Botox- [id number] . Authorization has been APPROVED from 10/07/22 to 10/07/23. You I will fax over the approval letter [facility] I will note this on the account, please fax to [phone] or upload directly to the patient portal. You i will do

## 2022-12-24 NOTE — Telephone Encounter (Addendum)
Patient called to get her zolpidem 12.5 mg switched to [id number] - [place], [place] - [address] AT [address]. Patient current pharmacy is out of stock.

## 2022-12-24 NOTE — Progress Notes (Signed)
Approval letter faxed to [place].

## 2022-12-24 NOTE — Telephone Encounter (Signed)
Informed pt that her Rx has been sent to [facility]

## 2022-12-24 NOTE — Telephone Encounter (Signed)
I know this was sent in yesterday  but pt is saying the [facility] she [facility] is out of her medication can we send in the Zolpidem 12.5 mg to [facility] [address] in [place]

## 2022-12-28 DIAGNOSIS — K219 Gastro-esophageal reflux disease without esophagitis: Secondary | ICD-10-CM | POA: Insufficient documentation

## 2022-12-28 NOTE — Assessment & Plan Note (Signed)
Deteriorated.  See A&P under insomnia above.  Start Wellbutrin 75mg  BID and monitor closely.

## 2022-12-28 NOTE — Assessment & Plan Note (Signed)
New to provider, ongoing for pt.  She is using 'Tums and Pepto like crazy'.  Has used OTC Omeprazole and Lansoprazole w/o relief.  Will start Protonix 40mg  daily and monitor for improvement.  Pt expressed understanding and is in agreement w/ plan.

## 2022-12-28 NOTE — Assessment & Plan Note (Signed)
Chronic problem.  She is able to fall asleep using Ambien CR 12.5mg  daily and Seroquel 50mg  nightly but has trouble staying asleep.  She wakes up feeling tired.  Sxs are much more common when she knows she has something to do the next day as her anxiety spikes.  Has had increased anxiety.  Finds herself tearful and overwhelmed.  In the past has been on Lexapro, Trintellix, and Zoloft.  Is hesitant to try medication but is open to the idea of trying Wellbutrin.  Will follow closely.

## 2022-12-29 ENCOUNTER — Other Ambulatory Visit: Payer: Self-pay

## 2022-12-29 ENCOUNTER — Ambulatory Visit: Payer: No Typology Code available for payment source | Admitting: Family Medicine

## 2022-12-29 ENCOUNTER — Encounter: Payer: Self-pay | Admitting: Family Medicine

## 2022-12-29 VITALS — BP 128/80 | HR 103 | Temp 97.9°F | Resp 17 | Ht 64.0 in | Wt 242.4 lb

## 2022-12-29 DIAGNOSIS — F32A Depression, unspecified: Secondary | ICD-10-CM | POA: Diagnosis not present

## 2022-12-29 DIAGNOSIS — K219 Gastro-esophageal reflux disease without esophagitis: Secondary | ICD-10-CM | POA: Diagnosis not present

## 2022-12-29 DIAGNOSIS — G4700 Insomnia, unspecified: Secondary | ICD-10-CM

## 2022-12-29 DIAGNOSIS — F419 Anxiety disorder, unspecified: Secondary | ICD-10-CM | POA: Diagnosis not present

## 2022-12-29 MED ORDER — BOTOX 200 UNITS IJ SOLR: INTRAMUSCULAR | 4 refills | Status: DC

## 2022-12-29 MED ORDER — OMEPRAZOLE 40 MG PO CPDR: 40.0000 mg | DELAYED_RELEASE_CAPSULE | Freq: Every day | ORAL | 3 refills | Status: DC

## 2022-12-29 MED ORDER — BUPROPION HCL ER (XL) 150 MG PO TB24: 150.0000 mg | ORAL_TABLET | Freq: Every day | ORAL | 3 refills | Status: DC

## 2022-12-29 NOTE — Telephone Encounter (Signed)
Per [facility] New Botox script needed.   Script sent.

## 2022-12-29 NOTE — Progress Notes (Signed)
   Subjective:    Patient ID: Monica Jefferson, female    DOB: 11-02-1981, 42 y.o.   MRN: [id number]  HPI Anxiety/Depression- at last visit started Wellbutrin 75mg  BID.  Pt denies side effects from medication but doesn't necessarily feel that mood has improved.  Is interested in increasing dose.  Sleep is 'a tad better'.  GERD- no improvement w/ Pantoprazole.  Pt actually feels sxs worsened   Review of Systems For ROS see HPI     Objective:   Physical Exam Vitals reviewed.  Constitutional:      General: She is not in acute distress.    Appearance: Normal appearance. She is obese. She is not ill-appearing.  HENT:     Head: Normocephalic and atraumatic.  Pulmonary:     Effort: Pulmonary effort is normal. No respiratory distress.  Skin:    General: Skin is warm and dry.  Neurological:     General: No focal deficit present.     Mental Status: She is alert and oriented to person, place, and time.  Psychiatric:        Mood and Affect: Mood normal.        Behavior: Behavior normal.        Thought Content: Thought content normal.           Assessment & Plan:

## 2022-12-29 NOTE — Patient Instructions (Signed)
Follow up by MyChart in 4-6 weeks to let me know how mood and sleep are going Increase the Wellbutrin to 150mg  daily (just once daily!)- new prescription sent Call w/ any questions or concerns Stay Safe!  Stay Healthy! Happy New Year!

## 2022-12-31 NOTE — Progress Notes (Signed)
Chat with [facility]:     CMA, A new script for BOtox 200 units [facility] [place] 1981/03/06 Zip: [id number], Was sent 12/29/22. Patient has an appt 01/02/23 Hello! A representative will be with you shortly. RACHEL has joined the conversation. RACHEL Good Afternoon! My name is Apolonio Schneiders. I'll be happy to assist you today. Please allow 2-3 minutes to access this patient's account. RACHEL I've located the patient's account in our system. Please allow me another 2-5 minutes to check on their Botox. RACHEL We are still working on processing the Botox Rx we received 12/29/2022. Typically new Rx's take an estimated 5-8 business days to process through our system, before becoming available to schedule. It looks like we are processing pretty fast - I am seeing we did finish benefit verification yesterday. When the Rx is available to schedule we should be attempting to reach out to do so. You okay thank you

## 2022-12-31 NOTE — Progress Notes (Signed)
Patient advised per [place], Pharmacy investigation is pending. May need to reschedule botox appt.   Will call if not set for delivery by tomorrow afternoon

## 2022-12-31 NOTE — Assessment & Plan Note (Signed)
No improvement w/ Protonix.  In fact, pt feels that sxs worsened.  Switch to Omeprazole and monitor for improvement.  Pt expressed understanding and is in agreement w/ plan.

## 2022-12-31 NOTE — Assessment & Plan Note (Signed)
Ongoing.  Pt doesn't report any side effects from medication but doesn't feel that things have improved either.  Will increase Wellbutrin to 150mg  daily and monitor closely.

## 2022-12-31 NOTE — Assessment & Plan Note (Signed)
Sleep has mildly improved.  Will increase dose of Wellbutrin and see if this is even more effective.

## 2023-01-01 ENCOUNTER — Telehealth: Payer: Self-pay | Admitting: Neurology

## 2023-01-01 NOTE — Telephone Encounter (Signed)
[facility] Tried calling patient with me  on the other line to give consent for delivery.  Per rep Patient advised him "she did not have time to talk to him and she will call him back".   Advised patient to take patient off the schedule Botox will not be delivered in time for appt 01/02/23. '  Patient advised. Per Patient consent was given before she went into a meeting earlier today. Advise patient once she get in touch with the pharmacy send [place] a mychart message and we will try to add her to the schedule next week.

## 2023-01-01 NOTE — Telephone Encounter (Signed)
Left message with the after hour service on 01-01-23 @12 :24 om    Caller states that she is needing to speak to someone about shipment of medication    Please call

## 2023-01-02 ENCOUNTER — Ambulatory Visit: Payer: No Typology Code available for payment source | Admitting: Neurology

## 2023-01-02 NOTE — Telephone Encounter (Signed)
Tried calling patient, Botox should be delivered by next Tuesday.

## 2023-01-07 ENCOUNTER — Ambulatory Visit: Payer: No Typology Code available for payment source | Admitting: Neurology

## 2023-01-07 DIAGNOSIS — G43709 Chronic migraine without aura, not intractable, without status migrainosus: Secondary | ICD-10-CM

## 2023-01-07 MED ORDER — ONABOTULINUMTOXINA 100 UNITS IJ SOLR
200.0000 [IU] | Freq: Once | INTRAMUSCULAR | Status: AC
  Administered 2023-01-07: 155 [IU] via INTRAMUSCULAR

## 2023-01-07 NOTE — Progress Notes (Signed)
Medication Samples have been provided to the patient.  Drug name: Zavzpret       Strength: 10 mg        Qty: 2  LOT: [id number]  Exp.Date: 7/25  Dosing instructions: as needed  The patient has been instructed regarding the correct time, dose, and frequency of taking this medication, including desired effects and most common side effects.   Venetia Night 10:39 AM 01/07/2023

## 2023-01-07 NOTE — Progress Notes (Signed)
[facility]  ° °Procedure Note Botox ° °Attending: Dr. Chianne Byrns ° °Preoperative Diagnosis(es): Chronic migraine ° °Consent obtained from: The patient °Benefits discussed included, but were not limited to decreased muscle tightness, increased joint range of motion, and decreased pain.  Risk discussed included, but were not limited pain and discomfort, bleeding, bruising, excessive weakness, venous thrombosis, muscle atrophy and dysphagia.  Anticipated outcomes of the procedure as well as he risks and benefits of the alternatives to the procedure, and the roles and tasks of the personnel to be involved, were discussed with the patient, and the patient consents to the procedure and agrees to proceed. A copy of the patient medication guide was given to the patient which explains the blackbox warning. ° °Patients identity and treatment sites confirmed Yes.  . ° °Details of Procedure: °Skin was cleaned with alcohol. Prior to injection, the needle plunger was aspirated to make sure the needle was not within a blood vessel.  There was no blood retrieved on aspiration.   ° °Following is a summary of the muscles injected  And the amount of Botulinum toxin used: ° °Dilution °200 units of Botox was reconstituted with 4 ml of preservative free normal saline. °Time of reconstitution: At the time of the office visit (<30 minutes prior to injection)  ° °Injections  °155 total units of Botox was injected with a 30 gauge needle. ° °Injection Sites: °L occipitalis: 15 units- 3 sites  °R occiptalis: 15 units- 3 sites ° °L upper trapezius: 15 units- 3 sites °R upper trapezius: 15 units- 3 sits          °L paraspinal: 10 units- 2 sites °R paraspinal: 10 units- 2 sites ° °Face °L frontalis(2 injection sites):10 units   °R frontalis(2 injection sites):10 units         °L corrugator: 5 units   °R corrugator: 5 units           °Procerus: 5 units   °L temporalis: 20 units °R temporalis: 20 units  ° °Agent:  °200 units of botulinum Type  A (Onobotulinum Toxin type A) was reconstituted with 4 ml of preservative free normal saline.  °Time of reconstitution: At the time of the office visit (<30 minutes prior to injection)  ° ° ° Total injected (Units):  155 ° Total wasted (Units):  45 ° °Patient tolerated procedure well without complications.   °Reinjection is anticipated in 3 months. ° ° °

## 2023-01-21 ENCOUNTER — Other Ambulatory Visit: Payer: Self-pay | Admitting: Family Medicine

## 2023-01-21 DIAGNOSIS — G4700 Insomnia, unspecified: Secondary | ICD-10-CM

## 2023-01-21 NOTE — Telephone Encounter (Signed)
Pt called stating zolpidem zolpidem (AMBIEN CR) 12.5 MG CR tablet  Pharmacy:   [id number] [place], [place] - [address] (Ph: [phone])    Reason insurance covers 90ct prescription pharmacy only had 30ct on hand and pt need new prescription

## 2023-01-21 NOTE — Telephone Encounter (Signed)
Ambien 12.5 mg LOV: 12/29/22 Last Refill:12/24/22 Upcoming appt: none  [facility]

## 2023-01-21 NOTE — Telephone Encounter (Signed)
I sent 90 last time

## 2023-01-21 NOTE — Telephone Encounter (Signed)
That was on 1/10

## 2023-01-21 NOTE — Telephone Encounter (Signed)
Called pt and she states her insurance is requiring a 90 day supply and we sent in a 30 day supply

## 2023-01-21 NOTE — Telephone Encounter (Signed)
Spoke to the pharmacist Dorine at [facility] .  She ran the Rx for the Ambien thru and she states they are only covering a 30 day supply now .,. Pt can call them tomorrow to refill per pharmacist .  I did explain this to the pt and she wanted to argue stating that her insurance sent a letter for 90 day and I did explain to her that the phar mist ran the Rx and it states 30 day and she needs to reach out to the pharmacy

## 2023-02-12 ENCOUNTER — Encounter: Payer: Self-pay | Admitting: Family Medicine

## 2023-03-06 ENCOUNTER — Telehealth: Payer: Self-pay

## 2023-03-06 NOTE — Telephone Encounter (Signed)
[name] is calling on behalf of Monica Jefferson, inquiring if the patients botox was a buy and bill or if it was filed through insurance. Asking for a call back, states phone number given is secure if need to leave a voicemail.

## 2023-03-06 NOTE — Telephone Encounter (Signed)
[name] called back, states they no longer need a call back. Figured out the information needed.

## 2023-03-12 ENCOUNTER — Encounter: Payer: Self-pay | Admitting: Family

## 2023-03-12 ENCOUNTER — Ambulatory Visit: Payer: No Typology Code available for payment source | Admitting: Family

## 2023-03-12 VITALS — BP 124/80 | HR 98 | Temp 97.7°F | Resp 17 | Ht 64.0 in | Wt 243.1 lb

## 2023-03-12 DIAGNOSIS — Z1152 Encounter for screening for COVID-19: Secondary | ICD-10-CM

## 2023-03-12 DIAGNOSIS — J069 Acute upper respiratory infection, unspecified: Secondary | ICD-10-CM | POA: Diagnosis not present

## 2023-03-12 DIAGNOSIS — R6889 Other general symptoms and signs: Secondary | ICD-10-CM | POA: Diagnosis not present

## 2023-03-12 LAB — POCT INFLUENZA A/B
Influenza A, POC: NEGATIVE
Influenza B, POC: NEGATIVE

## 2023-03-12 LAB — POC COVID19 BINAXNOW: SARS Coronavirus 2 Ag: NEGATIVE

## 2023-03-12 MED ORDER — PROMETHAZINE-DM 6.25-15 MG/5ML PO SYRP: 5.0000 mL | ORAL_SOLUTION | Freq: Four times a day (QID) | ORAL | 0 refills | Status: DC | PRN

## 2023-03-12 NOTE — Progress Notes (Signed)
Acute Office Visit  Subjective:     Patient ID: Monica Jefferson, female    DOB: 11-27-1981, 42 y.o.   MRN: [id number]  Chief Complaint  Patient presents with  . Nasal Congestion    Congestion and coughing x 2 days  Fever of 102 per pt  Diarrhea and body aches  Headache      HPI Patient is in today with c/o cough chest congestion, fever, diarrhea, body aches and headaches x 3 days. She reports both of her children being sick last week with a simlar illness. She has been taking Tessalon Perles and Advil that has helped.   Review of Systems  Constitutional:  Positive for chills, fever and malaise/fatigue.  Respiratory:  Positive for cough. Negative for shortness of breath.   Cardiovascular: Negative.   Gastrointestinal:  Positive for diarrhea, heartburn and nausea. Negative for vomiting.  Musculoskeletal: Negative.   Neurological: Negative.   Endo/Heme/Allergies: Negative.   Psychiatric/Behavioral: Negative.    All other systems reviewed and are negative. Past Medical History:  Diagnosis Date  . Allergic rhinitis   . Anal fistula   . Anxiety   . B12 deficiency   . Bipolar disorder in partial remission ([place])    FOLLOWED BY PCP  . Depression   . Eczema   . Frequent headaches   . Hemorrhoids   . History of hypothyroidism    per pt hx yrs ago  . Hypertension   . Iron deficiency anemia   . Obesity   . OSA (obstructive sleep apnea)    per pt dx  prior to gastric bypass in 2008 wore cpap for awhile but has not used in yrs  . PONV (postoperative nausea and vomiting)    severe    Social History   Socioeconomic History  . Marital status: Married    Spouse name: Not on file  . Number of children: 3  . Years of education: 81  . Highest education level: Not on file  Occupational History  . Occupation: self employed  Tobacco Use  . Smoking status: Former    Packs/day: 1.00    Years: 14.00    Additional pack years: 0.00    Total pack years: 14.00    Types:  Cigarettes    Quit date: 12/04/2019    Years since quitting: 3.2  . Smokeless tobacco: Never  Vaping Use  . Vaping Use: Never used  Substance and Sexual Activity  . Alcohol use: Yes    Comment: 1-2 daily  . Drug use: No  . Sexual activity: Yes    Birth control/protection: I.U.D.    Comment: Mirena IUD placed approx. 2016  Other Topics Concern  . Not on file  Social History Narrative   Right handed   Social Determinants of Health   Financial Resource Strain: Not on file  Food Insecurity: Not on file  Transportation Needs: Not on file  Physical Activity: Not on file  Stress: Not on file  Social Connections: Not on file  Intimate Partner Violence: Not on file    Past Surgical History:  Procedure Laterality Date  . EVALUATION UNDER ANESTHESIA WITH ANAL FISTULECTOMY N/A 08/14/2017   Procedure: ANAL EXAM UNDER ANESTHESIA;  Surgeon: Leighton Ruff, MD;  Location: [facility];  Service: General;  Laterality: N/A;  . FOOT SURGERY Bilateral 1990s  . GASTRIC BYPASS  2008 approx.  Marland Kitchen LAPAROSCOPIC CHOLECYSTECTOMY  2007 approx  . LAPAROSCOPIC GASTRIC BANDING  early 2000s  . LEEP    .  LIGATION OF INTERNAL FISTULA TRACT N/A 01/21/2018   Procedure: LIGATION OF INTERNAL FISTULA TRACT;  Surgeon: Leighton Ruff, MD;  Location: [facility];  Service: General;  Laterality: N/A;  . PLACEMENT OF SETON N/A 08/14/2017   Procedure: PLACEMENT OF SETON;  Surgeon: Leighton Ruff, MD;  Location: [facility];  Service: General;  Laterality: N/A;  . [place] N/A 10/09/2017   Procedure: PLACEMENT OF SETON;  Surgeon: Leighton Ruff, MD;  Location: [facility];  Service: General;  Laterality: N/A;  . TONSILLECTOMY AND ADENOIDECTOMY  child  . WISDOM TOOTH EXTRACTION  teen  . WRIST GANGLION EXCISION Right teen    Family History  Problem Relation Age of Onset  . Mental illness Mother   . Diverticulitis Mother   . Alcohol abuse Father   .  Mental illness Father   . Mental illness Maternal Aunt   . Mental illness Maternal Uncle   . Alcohol abuse Maternal Uncle   . Mental illness Maternal Grandmother   . Heart disease Maternal Grandmother   . Diabetes Maternal Grandmother   . Colon polyps Neg Hx   . Esophageal cancer Neg Hx   . Stomach cancer Neg Hx   . Rectal cancer Neg Hx     No Known Allergies  Current Outpatient Medications on File Prior to Visit  Medication Sig Dispense Refill  . Biotin w/ Vitamins C & E (HAIR/SKIN/NAILS PO) Take by mouth.    . botulinum toxin Type A (BOTOX) 200 units injection Inject 155 units IM into Multiple sites of the face, neck,head every 90 days 1 each 4  . levonorgestrel (MIRENA) 20 MCG/DAY IUD 1 each by Intrauterine route once.    . Multiple Vitamin (MULTIVITAMIN) tablet Take 1 tablet by mouth daily.    . ondansetron (ZOFRAN) 4 MG tablet Take 1 tablet (4 mg total) by mouth every 8 (eight) hours as needed for nausea or vomiting. (Patient taking differently: Take by mouth every 8 (eight) hours as needed for nausea or vomiting.) 30 tablet 0  . QUEtiapine (SEROQUEL) 50 MG tablet TAKE 3 TABLETS BY MOUTH DAILY 264 tablet 1  . tacrolimus (PROTOPIC) 0.1 % ointment Apply as directed    . triamcinolone cream (KENALOG) 0.1 % Apply as directed    . Ubrogepant (UBRELVY) 100 MG TABS Take 1 tablet by mouth as needed (May repeat after 2 hours.  Maximum 2 tablets in 24 hours.). 16 tablet 5  . Vitamin D, Ergocalciferol, (DRISDOL) 1.25 MG (50000 UNIT) CAPS capsule Take 1 capsule (50,000 Units total) by mouth every 7 (seven) days. 12 capsule 0  . zolpidem (AMBIEN CR) 12.5 MG CR tablet Take 1 tablet (12.5 mg total) by mouth at bedtime as needed. for sleep 90 tablet 0  . buPROPion (WELLBUTRIN XL) 150 MG 24 hr tablet Take 1 tablet (150 mg total) by mouth daily. (Patient not taking: Reported on 03/12/2023) 30 tablet 3  . omeprazole (PRILOSEC) 40 MG capsule Take 1 capsule (40 mg total) by mouth daily. (Patient not  taking: Reported on 03/12/2023) 30 capsule 3   No current facility-administered medications on file prior to visit.    BP 124/80   Pulse 98   Temp 97.7 F (36.5 C) (Temporal)   Resp 17   Ht 5\' 4"  (1.626 m)   Wt 243 lb 2 oz (110.3 kg)   SpO2 96%   BMI 41.73 kg/m chart      Objective:    BP 124/80   Pulse  98   Temp 97.7 F (36.5 C) (Temporal)   Resp 17   Ht 5\' 4"  (1.626 m)   Wt 243 lb 2 oz (110.3 kg)   SpO2 96%   BMI 41.73 kg/m    Physical Exam Vitals and nursing note reviewed.  Constitutional:      Appearance: Normal appearance. She is obese.  HENT:     Right Ear: Tympanic membrane, ear canal and external ear normal.     Left Ear: Tympanic membrane, ear canal and external ear normal.     Nose: Nose normal.     Mouth/Throat:     Mouth: Mucous membranes are moist.  Cardiovascular:     Rate and Rhythm: Normal rate and regular rhythm.  Pulmonary:     Effort: Pulmonary effort is normal.     Breath sounds: Normal breath sounds.  Abdominal:     General: Abdomen is flat.  Musculoskeletal:        General: Normal range of motion.     Cervical back: Normal range of motion and neck supple.  Skin:    General: Skin is warm and dry.  Neurological:     General: No focal deficit present.     Mental Status: She is alert and oriented to person, place, and time.  Psychiatric:        Mood and Affect: Mood normal.        Behavior: Behavior normal.   Results for orders placed or performed in visit on 03/12/23  POCT Influenza A/B  Result Value Ref Range   Influenza A, POC Negative Negative   Influenza B, POC Negative Negative  POC COVID-19  Result Value Ref Range   SARS Coronavirus 2 Ag Negative Negative        Assessment & Plan:   Problem List Items Addressed This Visit   None Visit Diagnoses     Encounter for screening for COVID-19    -  Primary   Relevant Orders   POC COVID-19 (Completed)   Flu-like symptoms       Relevant Orders   POCT Influenza A/B  (Completed)   Viral upper respiratory infection           Meds ordered this encounter  Medications  . promethazine-dextromethorphan (PROMETHAZINE-DM) 6.25-15 MG/5ML syrup    Sig: Take 5 mLs by mouth 4 (four) times daily as needed.    Dispense:  118 mL    Refill:  0   Call the office if symptoms worsen or persist. Rest, drink plenty of fluids. Add alkaline water to help with reflux. If no better, see PCP.  No follow-ups on file.  Kennyth Arnold, FNP

## 2023-03-18 ENCOUNTER — Other Ambulatory Visit: Payer: Self-pay | Admitting: Family Medicine

## 2023-03-19 ENCOUNTER — Encounter: Payer: Self-pay | Admitting: Family Medicine

## 2023-03-19 NOTE — Telephone Encounter (Signed)
Pt requesting additional meds please advise

## 2023-03-24 ENCOUNTER — Other Ambulatory Visit: Payer: Self-pay | Admitting: Family Medicine

## 2023-03-24 DIAGNOSIS — G4700 Insomnia, unspecified: Secondary | ICD-10-CM

## 2023-03-24 NOTE — Telephone Encounter (Signed)
Patient is requesting a refill of the following medications: Requested Prescriptions   Pending Prescriptions Disp Refills   zolpidem (AMBIEN CR) 12.5 MG CR tablet 90 tablet 0    Sig: Take 1 tablet (12.5 mg total) by mouth at bedtime as needed. for sleep    Date of patient request: 03/24/23 Last office visit: 03/12/23 Date of last refill: 12/24/22 Last refill amount: 90

## 2023-03-25 MED ORDER — ZOLPIDEM TARTRATE ER 12.5 MG PO TBCR: 12.5000 mg | EXTENDED_RELEASE_TABLET | Freq: Every evening | ORAL | 0 refills | Status: DC | PRN

## 2023-04-03 ENCOUNTER — Ambulatory Visit: Payer: No Typology Code available for payment source | Admitting: Neurology

## 2023-04-16 ENCOUNTER — Ambulatory Visit: Payer: No Typology Code available for payment source | Admitting: Neurology

## 2023-04-24 ENCOUNTER — Telehealth: Payer: Self-pay | Admitting: Neurology

## 2023-04-24 ENCOUNTER — Ambulatory Visit: Payer: No Typology Code available for payment source | Admitting: Neurology

## 2023-04-24 DIAGNOSIS — G43709 Chronic migraine without aura, not intractable, without status migrainosus: Secondary | ICD-10-CM | POA: Diagnosis not present

## 2023-04-24 MED ORDER — ONABOTULINUMTOXINA 100 UNITS IJ SOLR
200.0000 [IU] | Freq: Once | INTRAMUSCULAR | Status: AC
Start: 2023-04-24 — End: 2023-04-24
  Administered 2023-04-24: 155 [IU] via INTRAMUSCULAR

## 2023-04-24 NOTE — Progress Notes (Unsigned)
[facility]  ° °Procedure Note Botox ° °Attending: Dr. Karma Ansley ° °Preoperative Diagnosis(es): Chronic migraine ° °Consent obtained from: The patient °Benefits discussed included, but were not limited to decreased muscle tightness, increased joint range of motion, and decreased pain.  Risk discussed included, but were not limited pain and discomfort, bleeding, bruising, excessive weakness, venous thrombosis, muscle atrophy and dysphagia.  Anticipated outcomes of the procedure as well as he risks and benefits of the alternatives to the procedure, and the roles and tasks of the personnel to be involved, were discussed with the patient, and the patient consents to the procedure and agrees to proceed. A copy of the patient medication guide was given to the patient which explains the blackbox warning. ° °Patients identity and treatment sites confirmed Yes.  . ° °Details of Procedure: °Skin was cleaned with alcohol. Prior to injection, the needle plunger was aspirated to make sure the needle was not within a blood vessel.  There was no blood retrieved on aspiration.   ° °Following is a summary of the muscles injected  And the amount of Botulinum toxin used: ° °Dilution °200 units of Botox was reconstituted with 4 ml of preservative free normal saline. °Time of reconstitution: At the time of the office visit (<30 minutes prior to injection)  ° °Injections  °155 total units of Botox was injected with a 30 gauge needle. ° °Injection Sites: °L occipitalis: 15 units- 3 sites  °R occiptalis: 15 units- 3 sites ° °L upper trapezius: 15 units- 3 sites °R upper trapezius: 15 units- 3 sits          °L paraspinal: 10 units- 2 sites °R paraspinal: 10 units- 2 sites ° °Face °L frontalis(2 injection sites):10 units   °R frontalis(2 injection sites):10 units         °L corrugator: 5 units   °R corrugator: 5 units           °Procerus: 5 units   °L temporalis: 20 units °R temporalis: 20 units  ° °Agent:  °200 units of botulinum Type  A (Onobotulinum Toxin type A) was reconstituted with 4 ml of preservative free normal saline.  °Time of reconstitution: At the time of the office visit (<30 minutes prior to injection)  ° ° ° Total injected (Units):  155 ° Total wasted (Units):  45 ° °Patient tolerated procedure well without complications.   °Reinjection is anticipated in 3 months. ° ° °

## 2023-04-24 NOTE — Telephone Encounter (Signed)
New message    The patient R/s her follow up from 05/25/23 to 09/21/23 given her Botox injection appt is on 07/31/23.  Will this be okay with Dr. Everlena Cooper

## 2023-05-22 ENCOUNTER — Other Ambulatory Visit: Payer: Self-pay | Admitting: Family Medicine

## 2023-05-22 DIAGNOSIS — G4700 Insomnia, unspecified: Secondary | ICD-10-CM

## 2023-05-22 MED ORDER — ZOLPIDEM TARTRATE ER 12.5 MG PO TBCR: 12.5000 mg | EXTENDED_RELEASE_TABLET | Freq: Every evening | ORAL | 0 refills | Status: DC | PRN

## 2023-05-22 MED ORDER — ZOLPIDEM TARTRATE ER 12.5 MG PO TBCR
12.5000 mg | EXTENDED_RELEASE_TABLET | Freq: Every evening | ORAL | 0 refills | Status: DC | PRN
Start: 2023-05-22 — End: 2023-05-22

## 2023-05-22 NOTE — Telephone Encounter (Signed)
Noted.  Controlled substance database reviewed.  Ambien No. 30 last filled 04/24/2023.  Temporary refill provided to [place] this time as she is out of town and ordered in PCPs absence.

## 2023-05-22 NOTE — Telephone Encounter (Signed)
Pt called and said she in [place] till Sunday and is asking can we send it to [facility] [address]  [place] [address]

## 2023-05-22 NOTE — Telephone Encounter (Signed)
Pt is in Ct for a family emergency [facility] [place] will not send across state line can you please refill in [place] absence ? Pt is asking for it to go to [facility] [address]  [place]

## 2023-05-22 NOTE — Progress Notes (Signed)
Reviewed PDMP. Refilling medication since Dr. Beverely Low is out of office today.

## 2023-05-22 NOTE — Telephone Encounter (Signed)
Encourage patient to contact the pharmacy for refills or they can request refills through [facility]  WHAT PHARMACY WOULD THEY LIKE THIS SENT TO:  [address], [place], [place] [address]  MEDICATION NAME & DOSE: zolpidem (AMBIEN CR) 12.5 MG CR tablet    NOTES/COMMENTS FROM PATIENT:      Front office please notify patient: It takes 48-72 hours to process rx refill requests Ask patient to call pharmacy to ensure rx is ready before heading there.

## 2023-05-22 NOTE — Telephone Encounter (Signed)
Pt aware of refill.

## 2023-05-22 NOTE — Telephone Encounter (Signed)
Ambien 12.5 mg LOV: 03/12/23 Last Refill:03/25/23 Upcoming appt: none Dr Beverely Low pt can you refill in her absence please

## 2023-05-22 NOTE — Telephone Encounter (Signed)
Pt has been made aware. 

## 2023-05-25 ENCOUNTER — Ambulatory Visit: Payer: No Typology Code available for payment source | Admitting: Neurology

## 2023-05-25 NOTE — Telephone Encounter (Signed)
Patient has called stating she has found a pharmacy where she is that will do the refill for her.  [facility] [address], [address], [place], [place] [address]  Please advise

## 2023-05-26 MED ORDER — ZOLPIDEM TARTRATE ER 12.5 MG PO TBCR: 12.5000 mg | EXTENDED_RELEASE_TABLET | Freq: Every evening | ORAL | 0 refills | Status: DC | PRN

## 2023-05-26 NOTE — Telephone Encounter (Signed)
Patient is asking if refill can be done today as she has to get off the [place] to go get the medication from the pharmacy

## 2023-05-26 NOTE — Telephone Encounter (Signed)
I have informed the pt the Refill sent and explained to her to be ware this has crossed state lines twice now . Dr Neva Seat sent this to [place] on Friday 05/22/23

## 2023-05-26 NOTE — Telephone Encounter (Signed)
Prescription sent to [place] as requested

## 2023-06-03 ENCOUNTER — Ambulatory Visit: Payer: No Typology Code available for payment source | Admitting: Family Medicine

## 2023-06-03 ENCOUNTER — Encounter: Payer: Self-pay | Admitting: Family Medicine

## 2023-06-03 VITALS — BP 130/80 | HR 58 | Temp 98.2°F | Resp 17 | Ht 64.0 in | Wt 249.1 lb

## 2023-06-03 DIAGNOSIS — J029 Acute pharyngitis, unspecified: Secondary | ICD-10-CM

## 2023-06-03 DIAGNOSIS — K1370 Unspecified lesions of oral mucosa: Secondary | ICD-10-CM

## 2023-06-03 LAB — POCT RAPID STREP A (OFFICE): Rapid Strep A Screen: NEGATIVE

## 2023-06-03 MED ORDER — NYSTATIN 100000 UNIT/ML MT SUSP: 5.0000 mL | Freq: Four times a day (QID) | OROMUCOSAL | 0 refills | Status: DC

## 2023-06-03 MED ORDER — AMOXICILLIN 875 MG PO TABS: 875.0000 mg | ORAL_TABLET | Freq: Two times a day (BID) | ORAL | 0 refills | Status: AC

## 2023-06-03 NOTE — Patient Instructions (Signed)
Follow up as needed or as scheduled START the Amoxicillin twice daily w/ food in case of bacterial infection USE the magic mouthwash to help w/ pain Ibuprofen as needed Drink LOTS of fluids Call with any questions or concerns Hang in there!!!

## 2023-06-03 NOTE — Progress Notes (Signed)
   Subjective:    Patient ID: Monica Jefferson, female    DOB: [birth year], 42 y.o.   MRN: [id number]  HPI Sore throat- sxs started 'a few days ago' but reports it has been difficult to swallow for the last few days.  No fever.  No abd pain or nausea.  Denies HA.  + sinus pain/pressure.  No tooth pain.  Bilateral ear pain/fullness.  No known sick contacts but has been around extended family- including small children.   Review of Systems For ROS see HPI     Objective:   Physical Exam Vitals reviewed.  Constitutional:      General: She is not in acute distress.    Appearance: She is well-developed. She is not ill-appearing.  HENT:     Head: Normocephalic and atraumatic.     Nose: Nose normal.     Mouth/Throat:     Mouth: Mucous membranes are moist.     Pharynx: Posterior oropharyngeal erythema and uvula swelling (ulcer present on tip of uvula) present.     Tonsils: No tonsillar exudate.  Cardiovascular:     Rate and Rhythm: Normal rate and regular rhythm.     Heart sounds: Normal heart sounds.  Pulmonary:     Effort: Pulmonary effort is normal. No respiratory distress.     Breath sounds: Normal breath sounds. No wheezing or rales.  Musculoskeletal:     Cervical back: Normal range of motion and neck supple.  Lymphadenopathy:     Cervical: No cervical adenopathy.  Neurological:     Mental Status: She is alert.           Assessment & Plan:  Sore throat/Lesion of Uvula- new.  Pt w/ obvious ulcer of uvula.  Throat is red, swollen but no tonsillar exudate.  Discussed that this could be strep that is not detected on rapid test or this could be a viral process such as hand/foot/mouth.  Will start Amoxicillin in case of bacterial infection but did caution her that if this is a viral process, the antibiotics will not cover this.  Pt expressed understanding and is in agreement w/ plan.

## 2023-06-04 ENCOUNTER — Encounter: Payer: Self-pay | Admitting: Family Medicine

## 2023-06-04 MED ORDER — WEGOVY 0.25 MG/0.5ML ~~LOC~~ SOAJ
0.2500 mg | SUBCUTANEOUS | 1 refills | Status: DC
Start: 2023-06-04 — End: 2023-07-07

## 2023-06-10 ENCOUNTER — Other Ambulatory Visit (HOSPITAL_COMMUNITY): Payer: Self-pay

## 2023-06-10 ENCOUNTER — Telehealth: Payer: Self-pay

## 2023-06-10 NOTE — Telephone Encounter (Signed)
Patient Advocate Encounter   Received notification from [employer] that prior authorization is required for [facility] 0.25MG /0.5ML auto-injectors   Submitted: 06-10-2023 Key [id number]  Status is pending

## 2023-06-11 ENCOUNTER — Other Ambulatory Visit (HOSPITAL_COMMUNITY): Payer: Self-pay

## 2023-06-11 NOTE — Telephone Encounter (Signed)
Left vm for pt about approval.

## 2023-06-11 NOTE — Telephone Encounter (Signed)
Patient Advocate Encounter  Prior Authorization for [employer] 0.25mg /0.11ml has been approved with [employer].    PA# [id number] Effective dates: 06/10/23 through 01/10/24  Approval letter indexed to chart

## 2023-06-23 ENCOUNTER — Encounter: Payer: Self-pay | Admitting: Family Medicine

## 2023-07-06 ENCOUNTER — Encounter: Payer: Self-pay | Admitting: Family Medicine

## 2023-07-07 MED ORDER — WEGOVY 0.5 MG/0.5ML ~~LOC~~ SOAJ: 0.5000 mg | SUBCUTANEOUS | 1 refills | Status: DC

## 2023-07-13 ENCOUNTER — Other Ambulatory Visit: Payer: Self-pay

## 2023-07-13 ENCOUNTER — Telehealth: Payer: Self-pay | Admitting: Family Medicine

## 2023-07-13 MED ORDER — WEGOVY 1 MG/0.5ML ~~LOC~~ SOAJ
1.0000 mg | SUBCUTANEOUS | 0 refills | Status: DC
Start: 2023-07-13 — End: 2023-08-07

## 2023-07-13 NOTE — Telephone Encounter (Signed)
This was sent to the requested pharmacy.

## 2023-07-13 NOTE — Telephone Encounter (Signed)
[facility] called stating that [facility] [place] [facility] one box of the 1mg  wegovy and she wanted to see if Dr. Beverely Low could call it in. I let her know that I would have the medical assistant call her once this has been completed

## 2023-07-13 NOTE — Telephone Encounter (Signed)
I have informed pt that the medication was sent in

## 2023-07-16 ENCOUNTER — Ambulatory Visit: Payer: No Typology Code available for payment source | Admitting: Neurology

## 2023-07-24 ENCOUNTER — Telehealth: Payer: Self-pay | Admitting: Family Medicine

## 2023-07-24 DIAGNOSIS — G4700 Insomnia, unspecified: Secondary | ICD-10-CM

## 2023-07-24 MED ORDER — ZOLPIDEM TARTRATE ER 12.5 MG PO TBCR: 12.5000 mg | EXTENDED_RELEASE_TABLET | Freq: Every evening | ORAL | 3 refills | Status: DC | PRN

## 2023-07-24 NOTE — Telephone Encounter (Signed)
Pt informed

## 2023-07-24 NOTE — Telephone Encounter (Signed)
Refill request zolpidem (AMBIEN CR) 12.5 MG CR  Last refilled 05/16/2023 Last office visit. No upcoming appt.

## 2023-07-24 NOTE — Telephone Encounter (Signed)
Prescription filled.

## 2023-07-24 NOTE — Telephone Encounter (Signed)
Encourage patient to contact the pharmacy for refills or they can request refills through [facility]  WHAT PHARMACY WOULD THEY LIKE THIS SENT TO:  [id number] [place],  - [address]    MEDICATION NAME & DOSE: zolpidem (AMBIEN CR) 12.5 MG CR tablet   NOTES/COMMENTS FROM PATIENT:      Front office please notify patient: It takes 48-72 hours to process rx refill requests Ask patient to call pharmacy to ensure rx is ready before heading there.

## 2023-07-31 ENCOUNTER — Ambulatory Visit: Payer: No Typology Code available for payment source | Admitting: Neurology

## 2023-07-31 DIAGNOSIS — G43709 Chronic migraine without aura, not intractable, without status migrainosus: Secondary | ICD-10-CM | POA: Diagnosis not present

## 2023-07-31 MED ORDER — ONABOTULINUMTOXINA 100 UNITS IJ SOLR
200.0000 [IU] | Freq: Once | INTRAMUSCULAR | Status: AC
Start: 2023-07-31 — End: 2023-07-31
  Administered 2023-07-31: 155 [IU] via INTRAMUSCULAR

## 2023-07-31 NOTE — Progress Notes (Signed)

## 2023-08-06 ENCOUNTER — Other Ambulatory Visit: Payer: Self-pay | Admitting: Family Medicine

## 2023-08-27 ENCOUNTER — Other Ambulatory Visit: Payer: Self-pay | Admitting: Family Medicine

## 2023-08-31 ENCOUNTER — Encounter: Payer: Self-pay | Admitting: Family Medicine

## 2023-08-31 ENCOUNTER — Other Ambulatory Visit: Payer: Self-pay | Admitting: Family Medicine

## 2023-08-31 MED ORDER — WEGOVY 1.7 MG/0.75ML ~~LOC~~ SOAJ: 1.7000 mg | SUBCUTANEOUS | 3 refills | Status: DC

## 2023-08-31 NOTE — Telephone Encounter (Signed)
Pt requesting to increase wegovy dose

## 2023-09-02 ENCOUNTER — Other Ambulatory Visit (HOSPITAL_COMMUNITY): Payer: Self-pay

## 2023-09-03 ENCOUNTER — Telehealth: Payer: Self-pay | Admitting: Pharmacy Technician

## 2023-09-03 NOTE — Telephone Encounter (Signed)
error 

## 2023-09-18 NOTE — Progress Notes (Unsigned)
NEUROLOGY FOLLOW UP OFFICE NOTE  Monica Jefferson [id number]  Assessment/Plan:   1  Migraine without aura, without status migrainosus, not intractable -responding well to Botox    Migraine prevention:  Botox Migraine rescue:  Bernita Raisin 100mg  Limit use of pain relievers to no more than 2 days out of week to prevent risk of rebound or medication-overuse headache. Keep headache diary Refer to vestibular rehab.  Will refill diazepam which helped.       Subjective:  Monica Jefferson is a 42 year old female with Bipolar disorder, depression/anxiety who follows up for migraine.   UPDATEBernita Raisin?   There was a gap between July and January when she was unable to get Botox because her insurance was not approving it.  Since restarting Botox in January, she notes improvement.   Intensity:  dull Duration:  2 hours with Bernita Raisin Frequency:  no more than once a week She has some dull headaches that are treatable with Advil.   Over the past month, she has been experiencing vertigo.  It is mostly an undulating sensation but sometimes spinning.  Went to PCP.  Meclizine not effect.  Went to PCP and tried prescribed prednisone taper as well as diazepam and Zofran.   She did well for a few days but then symptoms seemed to get worse.  It is positional.  Moving around and looking around aggravates it.  It lasts a few seconds to a minute.  She has had this in the past.     Current NSAIDS:  Advil (occasional, less than once a week) Current analgesics:  none Current triptans:  none Current ergotamine:  none Current anti-emetic:  none Current muscle relaxants:  none Current anti-anxiolytic:  Valium PRN (for vertigo) Current sleep aide:  Ambien, Seroquel 150mg  at bedtime Current Antihypertensive medications:  none Current Antidepressant/antipsychotic medications:  Ambien, Seroquel 25mg  at bedtime Current Anticonvulsant medications:  none Current anti-CGRP:  none Current Vitamins/Herbal/Supplements:   none Current Antihistamines/Decongestants:  Scopolamine patch; Flonase Other therapy:  Botox Hormone/birth control:  IUD   Caffeine:  1 to 2 cans of Coke (usually caffeine-free) daily; No coffee.  Occasional frappe Smoker: 11 cigarettes a day.  Just started Chantix Diet:  2 Cokes a day. Exercise:  Not routine Depression:  stable; Anxiety:  yes Other pain:  Some back pain Sleep hygiene:  Poor.  She has insomnia.  She saw sleep medicine.  Sleep study did not demonstrate sleep apnea.     HISTORY:  Onset:  Childhood.   Location:  Left frontal, may become diffuse/face/periorbital, may radiate into neck Quality:  Dull ache progressing to throbbing Intensity:  severe.  She denies new headache, thunderclap headache Aura:  no Premonitory Phase:  none Postdrome:  fatigue Associated symptoms:  Nausea, photophobia, phonophobia, osmophobia.  She denies associated vomiting, visual disturbance, unilateral numbness or weakness. Duration:  May be a day but may occur off and on for 3 to 5 days Frequency:  Once a month Frequency of abortive medication: 2 to 3 days a week Triggers:  Emotional stress, seasonal allergies, perfumes/scented candles Relieving factors:  Closing eyes, rest Activity:  Daily activity aggravates She does have an underlying dull daily headache.   Has seen neurology in the past.  She has had brain MRI.   Past NSAIDS:  none Past analgesics:  Tylenol Past abortive triptans:  Sumatriptan 50mg ; Maxalt 10mg , Tosymra (caused nausea) Past abortive ergotamine:  none Past muscle relaxants:  none Past anti-emetic:  none Past antihypertensive medications:  propranolol  Past antidepressant medications:  sertraline Past anticonvulsant medications:  topiramate Past anti-CGRP:  Aimovig 70mg  (constipation), Nurtec (rescue, reduced intensity but not abort, made her anxious), Reyvow (made her anxious Past vitamins/Herbal/Supplements:  none Past antihistamines/decongestants:   meclizine Other past therapies:  Reyvow  PAST MEDICAL HISTORY: Past Medical History:  Diagnosis Date   Allergic rhinitis    Anal fistula    Anxiety    B12 deficiency    Bipolar disorder in partial remission (HCC)    FOLLOWED BY PCP   Depression    Eczema    Frequent headaches    Hemorrhoids    History of hypothyroidism    per pt hx yrs ago   Hypertension    Iron deficiency anemia    Obesity    OSA (obstructive sleep apnea)    per pt dx  prior to gastric bypass in 2008 wore cpap for awhile but has not used in yrs   PONV (postoperative nausea and vomiting)    severe    MEDICATIONS: Current Outpatient Medications on File Prior to Visit  Medication Sig Dispense Refill   Biotin w/ Vitamins C & E (HAIR/SKIN/NAILS PO) Take by mouth.     botulinum toxin Type A (BOTOX) 200 units injection Inject 155 units IM into Multiple sites of the face, neck,head every 90 days 1 each 4   buPROPion (WELLBUTRIN XL) 150 MG 24 hr tablet Take 1 tablet (150 mg total) by mouth daily. (Patient not taking: Reported on 06/03/2023) 30 tablet 3   levonorgestrel (MIRENA) 20 MCG/DAY IUD 1 each by Intrauterine route once.     magic mouthwash (nystatin, lidocaine, diphenhydrAMINE, alum & mag hydroxide) suspension Swish and swallow 5 mLs 4 (four) times daily. 180 mL 0   Multiple Vitamin (MULTIVITAMIN) tablet Take 1 tablet by mouth daily.     omeprazole (PRILOSEC) 40 MG capsule Take 1 capsule (40 mg total) by mouth daily. 30 capsule 3   ondansetron (ZOFRAN) 4 MG tablet Take 1 tablet (4 mg total) by mouth every 8 (eight) hours as needed for nausea or vomiting. (Patient not taking: Reported on 06/03/2023) 30 tablet 0   promethazine-dextromethorphan (PROMETHAZINE-DM) 6.25-15 MG/5ML syrup Take 5 mLs by mouth 4 (four) times daily as needed. (Patient not taking: Reported on 06/03/2023) 118 mL 0   QUEtiapine (SEROQUEL) 50 MG tablet TAKE 3 TABLETS BY MOUTH DAILY 264 tablet 1   Semaglutide-Weight Management (WEGOVY) 1.7  MG/0.75ML SOAJ INJECT 1.7 MG INTO THE SKIN ONCE A WEEK. 3 mL 3   tacrolimus (PROTOPIC) 0.1 % ointment Apply as directed     triamcinolone cream (KENALOG) 0.1 % Apply as directed     Ubrogepant (UBRELVY) 100 MG TABS Take 1 tablet by mouth as needed (May repeat after 2 hours.  Maximum 2 tablets in 24 hours.). 16 tablet 5   Vitamin D, Ergocalciferol, (DRISDOL) 1.25 MG (50000 UNIT) CAPS capsule Take 1 capsule (50,000 Units total) by mouth every 7 (seven) days. (Patient not taking: Reported on 06/03/2023) 12 capsule 0   zolpidem (AMBIEN CR) 12.5 MG CR tablet Take 1 tablet (12.5 mg total) by mouth at bedtime as needed. for sleep 30 tablet 3   No current facility-administered medications on file prior to visit.    ALLERGIES: No Known Allergies  FAMILY HISTORY: Family History  Problem Relation Age of Onset   Mental illness Mother    Diverticulitis Mother    Alcohol abuse Father    Mental illness Father    Mental illness Maternal Aunt  Mental illness Maternal Uncle    Alcohol abuse Maternal Uncle    Mental illness Maternal Grandmother    Heart disease Maternal Grandmother    Diabetes Maternal Grandmother    Colon polyps Neg Hx    Esophageal cancer Neg Hx    Stomach cancer Neg Hx    Rectal cancer Neg Hx       Objective:  *** General: No acute distress.  Patient appears ***-groomed.   Head:  Normocephalic/atraumatic Eyes:  Fundi examined but not visualized Neck: supple, no paraspinal tenderness, full range of motion Heart:  Regular rate and rhythm Lungs:  Clear to auscultation bilaterally Back: No paraspinal tenderness Neurological Exam: alert and oriented.  Speech fluent and not dysarthric, language intact.  CN II-XII intact. Bulk and tone normal, muscle strength 5/5 throughout.  Sensation to light touch intact.  Deep tendon reflexes 2+ throughout, toes downgoing.  Finger to nose testing intact.  Gait normal, Romberg negative.   Shon Millet, DO  CC: ***

## 2023-09-21 ENCOUNTER — Encounter: Payer: Self-pay | Admitting: Neurology

## 2023-09-21 ENCOUNTER — Ambulatory Visit: Payer: No Typology Code available for payment source | Admitting: Neurology

## 2023-09-21 VITALS — BP 138/85 | HR 84 | Ht 64.0 in | Wt 231.0 lb

## 2023-09-21 DIAGNOSIS — G43009 Migraine without aura, not intractable, without status migrainosus: Secondary | ICD-10-CM

## 2023-09-21 MED ORDER — UBRELVY 100 MG PO TABS: 1.0000 | ORAL_TABLET | ORAL | 11 refills | Status: DC | PRN

## 2023-09-21 NOTE — Patient Instructions (Signed)
Botox Bernita Raisin

## 2023-09-25 ENCOUNTER — Telehealth: Payer: Self-pay | Admitting: Pharmacy Technician

## 2023-09-25 ENCOUNTER — Other Ambulatory Visit (HOSPITAL_COMMUNITY): Payer: Self-pay

## 2023-09-25 NOTE — Telephone Encounter (Signed)
[employer] verification has been submitted. Benefit Verification:  [facility]  Pharmacy PA has been submitted for BOTOX 200 via CMM. INSURANCE: [employer] DATE SUBMITTED: 10.11.24 KEY: [id number] Status is pending

## 2023-09-28 ENCOUNTER — Other Ambulatory Visit (HOSPITAL_COMMUNITY): Payer: Self-pay

## 2023-09-28 NOTE — Telephone Encounter (Signed)
Pharmacy Patient Advocate Encounter  Received notification from [facility] that Prior Authorization for BOTOX 200U has been APPROVED from 10.11.24 to 10.11.25   PA #/Case ID/Reference #: [id number]

## 2023-10-07 ENCOUNTER — Encounter (INDEPENDENT_AMBULATORY_CARE_PROVIDER_SITE_OTHER): Payer: No Typology Code available for payment source | Admitting: Family Medicine

## 2023-10-08 MED ORDER — WEGOVY 2.4 MG/0.75ML ~~LOC~~ SOAJ: 2.4000 mg | SUBCUTANEOUS | 3 refills | Status: DC

## 2023-10-08 NOTE — Telephone Encounter (Signed)
[facility] VISIT   Patient agreed to [facility] visit and is aware that copayment and coinsurance may apply. Patient was treated using telemedicine according to accepted telemedicine protocols.  Subjective:   Patient complains of increased cravings despite Wegovy 1.7mg  weekly  Patient Active Problem List   Diagnosis Date Noted   Gastroesophageal reflux disease 12/28/2022   Morbid obesity (HCC) 02/18/2022   Physical exam 02/18/2022   Migraine 09/14/2018   Eczema of eyelid 04/29/2018   Seasonal allergies 05/08/2017   IUD (intrauterine device) in place 05/08/2017   Hypothyroidism 09/03/2016   Insomnia 09/03/2016   Bipolar disorder in partial remission (HCC) 09/03/2016   Hx of bariatric surgery 09/03/2016   B12 deficiency 09/03/2016   Anemia, iron deficiency 09/03/2016   Social History   Tobacco Use   Smoking status: Former    Current packs/day: 0.00    Average packs/day: 1 pack/day for 14.0 years (14.0 ttl pk-yrs)    Types: Cigarettes    Start date: 12/03/2005    Quit date: 12/04/2019    Years since quitting: 3.8   Smokeless tobacco: Never  Substance Use Topics   Alcohol use: Yes    Comment: 1-2 daily    Current Outpatient Medications:    Semaglutide-Weight Management (WEGOVY) 2.4 MG/0.75ML SOAJ, Inject 2.4 mg into the skin once a week., Disp: 3 mL, Rfl: 3   Biotin w/ Vitamins C & E (HAIR/SKIN/NAILS PO), Take by mouth., Disp: , Rfl:    botulinum toxin Type A (BOTOX) 200 units injection, Inject 155 units IM into Multiple sites of the face, neck,head every 90 days, Disp: 1 each, Rfl: 4   buPROPion (WELLBUTRIN XL) 150 MG 24 hr tablet, Take 1 tablet (150 mg total) by mouth daily. (Patient not taking: Reported on 06/03/2023), Disp: 30 tablet, Rfl: 3   levonorgestrel (MIRENA) 20 MCG/DAY IUD, 1 each by Intrauterine route once., Disp: , Rfl:    magic mouthwash (nystatin, lidocaine, diphenhydrAMINE, alum & mag hydroxide) suspension, Swish and swallow 5 mLs 4 (four) times daily., Disp: 180  mL, Rfl: 0   Multiple Vitamin (MULTIVITAMIN) tablet, Take 1 tablet by mouth daily., Disp: , Rfl:    omeprazole (PRILOSEC) 40 MG capsule, Take 1 capsule (40 mg total) by mouth daily., Disp: 30 capsule, Rfl: 3   ondansetron (ZOFRAN) 4 MG tablet, Take 1 tablet (4 mg total) by mouth every 8 (eight) hours as needed for nausea or vomiting. (Patient not taking: Reported on 06/03/2023), Disp: 30 tablet, Rfl: 0   promethazine-dextromethorphan (PROMETHAZINE-DM) 6.25-15 MG/5ML syrup, Take 5 mLs by mouth 4 (four) times daily as needed. (Patient not taking: Reported on 06/03/2023), Disp: 118 mL, Rfl: 0   QUEtiapine (SEROQUEL) 50 MG tablet, TAKE 3 TABLETS BY MOUTH DAILY, Disp: 264 tablet, Rfl: 1   tacrolimus (PROTOPIC) 0.1 % ointment, Apply as directed, Disp: , Rfl:    triamcinolone cream (KENALOG) 0.1 %, Apply as directed, Disp: , Rfl:    Ubrogepant (UBRELVY) 100 MG TABS, Take 1 tablet (100 mg total) by mouth as needed (May repeat after 2 hours.  Maximum 2 tablets in 24 hours.)., Disp: 16 tablet, Rfl: 11   Vitamin D, Ergocalciferol, (DRISDOL) 1.25 MG (50000 UNIT) CAPS capsule, Take 1 capsule (50,000 Units total) by mouth every 7 (seven) days. (Patient not taking: Reported on 06/03/2023), Disp: 12 capsule, Rfl: 0   zolpidem (AMBIEN CR) 12.5 MG CR tablet, Take 1 tablet (12.5 mg total) by mouth at bedtime as needed. for sleep, Disp: 30 tablet, Rfl: 3  No Known Allergies  Assessment and Plan:   Diagnosis: Obesity. Please see myChart communication and orders below.   No orders of the defined types were placed in this encounter.  Meds ordered this encounter  Medications   Semaglutide-Weight Management (WEGOVY) 2.4 MG/0.75ML SOAJ    Sig: Inject 2.4 mg into the skin once a week.    Dispense:  3 mL    Refill:  3    Neena Rhymes, MD 10/08/2023  A total of 6 minutes were spent by me to personally review the patient-generated inquiry, review patient records and data pertinent to assessment of the patient's  problem, develop a management plan including generation of prescriptions and/or orders, and on subsequent communication with the patient through secure the MyChart portal service.   There is no separately reported E/M service related to this service in the past 7 days nor does the patient have an upcoming soonest available appointment for this issue. This work was completed in less than 7 days.   The patient consented to this service today (see patient agreement prior to ongoing communication). Patient counseled regarding the need for in-person exam for certain conditions and was advised to call the office if any changing or worsening symptoms occur.   The codes to be used for the E/M service are: [x]   99421 for 5-10 minutes of time spent on the inquiry. []   [id number] for 11-20 minutes. []   [id number] for 21+ minutes.

## 2023-10-08 NOTE — Telephone Encounter (Signed)
Pt notes an increase in cravings where they had been more manageable before. Asking if it is time to increase dose or change in medication  Last OV 06/03/23 f/u PRN  Please advise

## 2023-10-30 ENCOUNTER — Ambulatory Visit: Payer: No Typology Code available for payment source | Admitting: Neurology

## 2023-11-02 ENCOUNTER — Encounter: Payer: Self-pay | Admitting: Family Medicine

## 2023-11-06 ENCOUNTER — Other Ambulatory Visit: Payer: Self-pay | Admitting: Family Medicine

## 2023-11-18 ENCOUNTER — Encounter: Payer: Self-pay | Admitting: Family Medicine

## 2023-11-18 ENCOUNTER — Ambulatory Visit: Payer: No Typology Code available for payment source | Admitting: Family Medicine

## 2023-11-18 VITALS — BP 128/82 | HR 66 | Temp 97.8°F | Ht 64.0 in | Wt 224.2 lb

## 2023-11-18 DIAGNOSIS — E669 Obesity, unspecified: Secondary | ICD-10-CM | POA: Diagnosis not present

## 2023-11-18 DIAGNOSIS — F3175 Bipolar disorder, in partial remission, most recent episode depressed: Secondary | ICD-10-CM

## 2023-11-18 MED ORDER — CAPLYTA 21 MG PO CAPS: 1.0000 | ORAL_CAPSULE | Freq: Every day | ORAL | 3 refills | Status: DC

## 2023-11-18 NOTE — Patient Instructions (Signed)
Follow up in 4 weeks to recheck mood ADD the Caplyta nightly Check out the [employer] website and see if there is a coupon card you are eligible for CONTINUE the Seroquel at the current dose for now Continue to work on healthy diet and regular exercise- you look great! Call with any questions or concerns Hang in there! Happy Holidays!

## 2023-11-18 NOTE — Progress Notes (Signed)
   Subjective:    Patient ID: Monica Jefferson, female    DOB: 04-08-1981, 42 y.o.   MRN: [id number]  HPI Obesity- pt is down 25 lbs since last visit due to [facility].  Is still considering tummy tuck and cosmetic surgery.  Does find that the medication has plateau'd.  Knows she is on the highest dose of Wegovy and isn't sure if the insurance company will pay for her to switch to Zepbound.  Bipolar- ongoing issue.  Currently on Seroquel 150mg  nightly.  Reports mood is 'eh'.  She knows she's needs something but isn't sure what that something is.  Did not feel good on Wellbutrin.  Would like to feel better if better is possible.  Review of Systems For ROS see HPI     Objective:   Physical Exam Vitals reviewed.  Constitutional:      General: She is not in acute distress.    Appearance: Normal appearance. She is not ill-appearing.  HENT:     Head: Normocephalic and atraumatic.  Eyes:     Extraocular Movements: Extraocular movements intact.     Conjunctiva/sclera: Conjunctivae normal.  Cardiovascular:     Rate and Rhythm: Normal rate and regular rhythm.  Pulmonary:     Effort: Pulmonary effort is normal.  Skin:    General: Skin is warm and dry.  Neurological:     General: No focal deficit present.     Mental Status: She is alert and oriented to person, place, and time.  Psychiatric:        Mood and Affect: Mood normal.        Behavior: Behavior normal.        Thought Content: Thought content normal.           Assessment & Plan:

## 2023-11-18 NOTE — Assessment & Plan Note (Signed)
Ongoing.  Pt admits that things are 'fine' but not good.  She is overwhelmed with the holidays.  Stressed about her kids.  She feels like she should feel better than she does.  Has been on Seroquel for quite awhile.  Did not like how she felt on Wellbutrin but can't remember the specifics.  Will try adding Caplyta in the hopes we can wean the Seroquel and improve her mood.  Reviewed possible side effects.  Pt expressed understanding and is in agreement w/ plan.

## 2023-11-18 NOTE — Assessment & Plan Note (Signed)
Improving.  Pt is down 25 lbs since last visit by using Wegovy.  Applauded her efforts and encouraged her to continue her focus on healthy diet and regular exercise.  If she starts to regain some of the weight she has lost, will see about switching to Zepbound.  Will continue to follow.

## 2023-11-19 ENCOUNTER — Encounter: Payer: Self-pay | Admitting: Family Medicine

## 2023-11-19 ENCOUNTER — Other Ambulatory Visit: Payer: Self-pay | Admitting: Family Medicine

## 2023-11-19 DIAGNOSIS — G4700 Insomnia, unspecified: Secondary | ICD-10-CM

## 2023-11-19 MED ORDER — FAMOTIDINE 40 MG PO TABS: 40.0000 mg | ORAL_TABLET | Freq: Every day | ORAL | 3 refills | Status: DC

## 2023-11-19 MED ORDER — ZOLPIDEM TARTRATE ER 12.5 MG PO TBCR: 12.5000 mg | EXTENDED_RELEASE_TABLET | Freq: Every evening | ORAL | 3 refills | Status: DC | PRN

## 2023-11-19 NOTE — Telephone Encounter (Signed)
Requested Prescriptions   Pending Prescriptions Disp Refills   zolpidem (AMBIEN CR) 12.5 MG CR tablet [Pharmacy Med Name: ZOLPIDEM TART ER 12.5 MG TAB] 30 tablet 0    Sig: Take 1 tablet (12.5 mg total) by mouth at bedtime as needed. for sleep     Date of patient request: 11/19/2023 Last office visit: 11/18/2023 Upcoming visit: Visit date not found UDS Updated 11/18/2023 Controlled Contract on file

## 2023-11-19 NOTE — Addendum Note (Signed)
Addended by: Sheliah Hatch on: 11/19/2023 04:01 PM   Modules accepted: Orders

## 2023-11-21 ENCOUNTER — Emergency Department (HOSPITAL_BASED_OUTPATIENT_CLINIC_OR_DEPARTMENT_OTHER)
Admission: EM | Admit: 2023-11-21 | Discharge: 2023-11-21 | Disposition: A | Discharge status: Home / Self Care | Payer: No Typology Code available for payment source | Attending: Emergency Medicine | Admitting: Emergency Medicine

## 2023-11-21 ENCOUNTER — Other Ambulatory Visit: Payer: Self-pay

## 2023-11-21 ENCOUNTER — Emergency Department (HOSPITAL_BASED_OUTPATIENT_CLINIC_OR_DEPARTMENT_OTHER): Payer: No Typology Code available for payment source

## 2023-11-21 ENCOUNTER — Encounter (HOSPITAL_BASED_OUTPATIENT_CLINIC_OR_DEPARTMENT_OTHER): Payer: Self-pay

## 2023-11-21 DIAGNOSIS — Z20822 Contact with and (suspected) exposure to covid-19: Secondary | ICD-10-CM | POA: Diagnosis not present

## 2023-11-21 DIAGNOSIS — J189 Pneumonia, unspecified organism: Secondary | ICD-10-CM | POA: Insufficient documentation

## 2023-11-21 DIAGNOSIS — R509 Fever, unspecified: Secondary | ICD-10-CM | POA: Diagnosis present

## 2023-11-21 LAB — HCG, SERUM, QUALITATIVE: Preg, Serum: NEGATIVE

## 2023-11-21 LAB — RESP PANEL BY RT-PCR (RSV, FLU A&B, COVID)  RVPGX2
Influenza A by PCR: NEGATIVE
Influenza B by PCR: NEGATIVE
Resp Syncytial Virus by PCR: NEGATIVE
SARS Coronavirus 2 by RT PCR: NEGATIVE

## 2023-11-21 LAB — BASIC METABOLIC PANEL
Anion gap: 8 (ref 5–15)
BUN: 11 mg/dL (ref 6–20)
CO2: 24 mmol/L (ref 22–32)
Calcium: 8.7 mg/dL — ABNORMAL LOW (ref 8.9–10.3)
Chloride: 105 mmol/L (ref 98–111)
Creatinine, Ser: 0.66 mg/dL (ref 0.44–1.00)
GFR, Estimated: >60 mL/min (ref >60)
Glucose, Bld: 88 mg/dL (ref 70–99)
Potassium: 3.8 mmol/L (ref 3.5–5.1)
Sodium: 137 mmol/L (ref 135–145)

## 2023-11-21 LAB — CBC WITH DIFFERENTIAL/PLATELET
Abs Immature Granulocytes: 0.03 10*3/uL (ref 0.00–0.07)
Basophils Absolute: 0 10*3/uL (ref 0.0–0.1)
Basophils Relative: 0 %
Eosinophils Absolute: 0 10*3/uL (ref 0.0–0.5)
Eosinophils Relative: 1 %
HCT: 33.1 % — ABNORMAL LOW (ref 36.0–46.0)
Hemoglobin: 10.8 g/dL — ABNORMAL LOW (ref 12.0–15.0)
Immature Granulocytes: 0 %
Lymphocytes Relative: 16 %
Lymphs Abs: 1.2 10*3/uL (ref 0.7–4.0)
MCH: 30.8 pg (ref 26.0–34.0)
MCHC: 32.6 g/dL (ref 30.0–36.0)
MCV: 94.3 fL (ref 80.0–100.0)
Monocytes Absolute: 0.5 10*3/uL (ref 0.1–1.0)
Monocytes Relative: 6 %
Neutro Abs: 5.9 10*3/uL (ref 1.7–7.7)
Neutrophils Relative %: 77 %
Platelets: 224 10*3/uL (ref 150–400)
RBC: 3.51 MIL/uL — ABNORMAL LOW (ref 3.87–5.11)
RDW: 16.4 % — ABNORMAL HIGH (ref 11.5–15.5)
WBC: 7.7 10*3/uL (ref 4.0–10.5)
nRBC: 0 % (ref 0.0–0.2)

## 2023-11-21 MED ORDER — IOHEXOL 350 MG/ML SOLN
75.0000 mL | Freq: Once | INTRAVENOUS | Status: AC | PRN
  Administered 2023-11-21: 75 mL via INTRAVENOUS

## 2023-11-21 MED ORDER — DOXYCYCLINE HYCLATE 100 MG PO TABS
100.0000 mg | ORAL_TABLET | Freq: Once | ORAL | Status: AC
  Administered 2023-11-21: 100 mg via ORAL
  Filled 2023-11-21: qty 1

## 2023-11-21 MED ORDER — DOXYCYCLINE HYCLATE 100 MG PO CAPS: 100.0000 mg | ORAL_CAPSULE | Freq: Two times a day (BID) | ORAL | 0 refills | Status: DC

## 2023-11-21 NOTE — ED Triage Notes (Signed)
She reports "coughing up blood" today. She states she had fever of ~ 100F at home. She is ambulatory and in no distress.

## 2023-11-21 NOTE — ED Provider Notes (Signed)
Monica Jefferson Provider Note   CSN: [id number] Arrival date & time: 11/21/23  1708     History  Chief Complaint  Patient presents with   Hemoptysis    Monica Jefferson is a 42 y.o. female.  Patient here with fever cough sputum production with maybe some blood in it.  History of depression anxiety.  She is not on blood thinners.  No recent surgery or travel or blood clot history.  She denies any shortness of breath chest pain weakness numbness tingling.  Cough and has actually improved and stopped.  Fevers resolved but she is taking medicine for it.  The history is provided by the patient.       Home Medications Prior to Admission medications   Medication Sig Start Date End Date Taking? Authorizing Provider  doxycycline (VIBRAMYCIN) 100 MG capsule Take 1 capsule (100 mg total) by mouth 2 (two) times daily. 11/21/23  Yes Venus Gilles, DO  Biotin w/ Vitamins C & E (HAIR/SKIN/NAILS PO) Take by mouth.    [provider]  botulinum toxin Type A (BOTOX) 200 units injection Inject 155 units IM into Multiple sites of the face, neck,head every 90 days 12/29/22   Drema Dallas, DO  famotidine (PEPCID) 40 MG tablet Take 1 tablet (40 mg total) by mouth daily. 11/19/23   Sheliah Hatch, MD  levonorgestrel (MIRENA) 20 MCG/DAY IUD 1 each by Intrauterine route once.    [provider]  Lumateperone Tosylate (CAPLYTA) 21 MG CAPS Take 1 capsule by mouth at bedtime. 11/18/23   Sheliah Hatch, MD  Multiple Vitamin (MULTIVITAMIN) tablet Take 1 tablet by mouth daily.    [provider]  ondansetron (ZOFRAN) 4 MG tablet Take 1 tablet (4 mg total) by mouth every 8 (eight) hours as needed for nausea or vomiting. 05/14/22   Sheliah Hatch, MD  pantoprazole (PROTONIX) 40 MG tablet Take 40 mg by mouth daily.    [provider]  QUEtiapine (SEROQUEL) 50 MG tablet TAKE 3 TABLETS BY MOUTH DAILY 08/27/23   Sheliah Hatch,  MD  Semaglutide-Weight Management (WEGOVY) 2.4 MG/0.75ML SOAJ Inject 2.4 mg into the skin once a week. 10/08/23   Sheliah Hatch, MD  tacrolimus (PROTOPIC) 0.1 % ointment Apply as directed 04/17/19   [provider]  triamcinolone cream (KENALOG) 0.1 % Apply as directed 04/17/19   [provider]  Ubrogepant (UBRELVY) 100 MG TABS Take 1 tablet (100 mg total) by mouth as needed (May repeat after 2 hours.  Maximum 2 tablets in 24 hours.). 09/21/23   Everlena Cooper, Kam Kushnir R, DO  zolpidem (AMBIEN CR) 12.5 MG CR tablet Take 1 tablet (12.5 mg total) by mouth at bedtime as needed. for sleep 11/19/23   Sheliah Hatch, MD      Allergies    Patient has no known allergies.    Review of Systems   Review of Systems  Physical Exam Updated Vital Signs BP (!) 152/99 (BP Location: Right Arm)   Pulse 80   Temp 97.9 F (36.6 C) (Oral)   Resp 16   LMP  (LMP Unknown)   SpO2 95%  Physical Exam Vitals and nursing note reviewed.  Constitutional:      General: She is not in acute distress.    Appearance: She is well-developed.  HENT:     Head: Normocephalic and atraumatic.     Nose: Nose normal.     Mouth/Throat:     Mouth: Mucous  membranes are moist.  Eyes:     Extraocular Movements: Extraocular movements intact.     Conjunctiva/sclera: Conjunctivae normal.     Pupils: Pupils are equal, round, and reactive to light.  Cardiovascular:     Rate and Rhythm: Normal rate and regular rhythm.     Pulses: Normal pulses.     Heart sounds: Normal heart sounds. No murmur heard. Pulmonary:     Effort: Pulmonary effort is normal. No respiratory distress.     Breath sounds: Normal breath sounds.  Abdominal:     Palpations: Abdomen is soft.     Tenderness: There is no abdominal tenderness.  Musculoskeletal:        General: No swelling.     Cervical back: Neck supple.  Skin:    General: Skin is warm and dry.     Capillary Refill: Capillary refill takes less than 2 seconds.  Neurological:      General: No focal deficit present.     Mental Status: She is alert.  Psychiatric:        Mood and Affect: Mood normal.     ED Results / Procedures / Treatments   Labs (all labs ordered are listed, but only abnormal results are displayed) Labs Reviewed  CBC WITH DIFFERENTIAL/PLATELET - Abnormal; Notable for the following components:      Result Value   RBC 3.51 (*)    Hemoglobin 10.8 (*)    HCT 33.1 (*)    RDW 16.4 (*)    All other components within normal limits  BASIC METABOLIC PANEL - Abnormal; Notable for the following components:   Calcium 8.7 (*)    All other components within normal limits  RESP PANEL BY RT-PCR (RSV, FLU A&B, COVID)  RVPGX2  HCG, SERUM, QUALITATIVE    EKG EKG Interpretation Date/Time:  Saturday November 21 2023 18:29:06 EST Ventricular Rate:  80 PR Interval:  117 QRS Duration:  101 QT Interval:  394 QTC Calculation: 455 R Axis:   40  Text Interpretation: Sinus rhythm Confirmed by Virgina Norfolk [phone]) on 11/21/2023 6:35:41 PM  Radiology CT Angio Chest PE W and/or Wo Contrast  Result Date: 11/21/2023 CLINICAL DATA:  Hemoptysis, fever EXAM: CT ANGIOGRAPHY CHEST WITH CONTRAST TECHNIQUE: Multidetector CT imaging of the chest was performed using the standard protocol during bolus administration of intravenous contrast. Multiplanar CT image reconstructions and MIPs were obtained to evaluate the vascular anatomy. RADIATION DOSE REDUCTION: This exam was performed according to the departmental dose-optimization program which includes automated exposure control, adjustment of the mA and/or kV according to patient size and/or use of iterative reconstruction technique. CONTRAST:  75mL OMNIPAQUE IOHEXOL 350 MG/ML SOLN COMPARISON:  Chest radiograph dated 11/21/2023 FINDINGS: Cardiovascular: Satisfactory opacification the bilateral pulmonary arteries to the segmental level. No evidence rim embolism. Although not tailored for evaluation of the thoracic aorta, there is  no evidence of thoracic aortic aneurysm or dissection. The heart is normal in size.  No pericardial effusion. Mediastinum/Nodes: No suspicious mediastinal lymphadenopathy. Visualized thyroid is unremarkable. Lungs/Pleura: Multifocal patchy opacities in the left lower lobe, suspicious for pneumonia. Mild ground-glass opacity/mosaic attenuation in the upper lobes and right lower lobe. No pleural effusion or pneumothorax. Upper Abdomen: Visualized upper abdomen is notable for geographic hepatic steatosis in the left hepatic lobe and two probable hepatic cysts measuring 16 17 mm (series 4/image 149). Prior cholecystectomy. Musculoskeletal: Visualized osseous structures are within normal limits. Review of the MIP images confirms the above findings. IMPRESSION: No evidence of pulmonary embolism. Multifocal left  lower lobe pneumonia. Follow-up chest radiographs are suggested in 4-6 weeks after appropriate antimicrobial therapy. Electronically Signed   By: Charline Bills M.D.   On: 11/21/2023 19:53   DG Chest Portable 1 View  Result Date: 11/21/2023 CLINICAL DATA:  Patient complains of coughing up blood EXAM: PORTABLE CHEST 1 VIEW COMPARISON:  None Available. FINDINGS: Normal heart size. No pleural fluid or interstitial edema. Asymmetric peribronchial opacity within the left mid lung is identified. Right lung appears clear. Visualized osseous structures are unremarkable. IMPRESSION: Asymmetric peribronchial opacity within the left mid lung. Differential considerations include bronchitis versus developing pneumonia. Followup PA and lateral chest X-ray is recommended in 3-4 weeks following trial of antibiotic therapy to ensure resolution and exclude underlying malignancy. Electronically Signed   By: Signa Kell M.D.   On: 11/21/2023 18:00    Procedures Procedures    Medications Ordered in ED Medications  doxycycline (VIBRA-TABS) tablet 100 mg (has no administration in time range)  iohexol (OMNIPAQUE) 350  MG/ML injection 75 mL (75 mLs Intravenous Contrast Given 11/21/23 1944)    ED Course/ Medical Decision Making/ A&P                                 Medical Decision Making Amount and/or Complexity of Data Reviewed Labs: ordered. Radiology: ordered.  Risk Prescription drug management.   Mercie Eon Flemmer is here for cough and blood in her sputum.  Normal vitals.  No fever.  BC BMP D-dimer chest x-ray COVID and flu test.  Differential diagnosis likely bronchitis/viral process but could be pneumonia or PE.  Have no concern for ACS.  Not having any chest pain.  Not a typical story for ACS but will get EKG.  She is overall very well-appearing.  Clear breath sounds.  No wheezing.  Chest x-ray consistent with pneumonia but will get a CT scan to further evaluate for PE and further evaluate pneumonia.  She however has no white count or significant electrolyte finding per my review and interpretation of her labs.  Of not concern for sepsis.  Viral testing negative.  CT scan negative for PE.  However does show lower lobe pneumonia on the left.  She understands to take antibiotics and follow-up with primary care doctor for repeat imaging to make sure this resolves.  Discharged in good condition.  Have no concern for systemic process.  This chart was dictated using voice recognition software.  Despite best efforts to proofread,  errors can occur which can change the documentation meaning.         Final Clinical Impression(s) / ED Diagnoses Final diagnoses:  Community acquired pneumonia, unspecified laterality    Rx / DC Orders ED Discharge Orders          Ordered    doxycycline (VIBRAMYCIN) 100 MG capsule  2 times daily        11/21/23 2005              Virgina Norfolk, DO 11/21/23 2007

## 2023-12-04 ENCOUNTER — Ambulatory Visit: Payer: No Typology Code available for payment source | Admitting: Neurology

## 2023-12-04 DIAGNOSIS — G43709 Chronic migraine without aura, not intractable, without status migrainosus: Secondary | ICD-10-CM

## 2023-12-04 MED ORDER — ONABOTULINUMTOXINA 100 UNITS IJ SOLR
200.0000 [IU] | Freq: Once | INTRAMUSCULAR | Status: AC
  Administered 2023-12-04: 155 [IU] via INTRAMUSCULAR

## 2023-12-04 NOTE — Progress Notes (Signed)

## 2024-01-13 ENCOUNTER — Encounter: Payer: Self-pay | Admitting: Family Medicine

## 2024-01-14 ENCOUNTER — Other Ambulatory Visit (HOSPITAL_COMMUNITY): Payer: Self-pay

## 2024-01-14 ENCOUNTER — Telehealth: Payer: Self-pay

## 2024-01-14 MED ORDER — ZEPBOUND 10 MG/0.5ML ~~LOC~~ SOAJ: 10.0000 mg | SUBCUTANEOUS | 1 refills | Status: DC

## 2024-01-14 NOTE — Telephone Encounter (Signed)
Pharmacy Patient Advocate Encounter   Received notification from  [facility] Portal that prior authorization for Zepbound 10MG /0.5ML pen-injectors is required/requested.   Insurance verification completed.   The patient is insured through [facility] .   Per test claim: PA required; PA submitted to above mentioned insurance via CoverMyMeds Key/confirmation #/EOC [id number] Status is pending

## 2024-01-14 NOTE — Addendum Note (Signed)
Addended by: Sheliah Hatch on: 01/14/2024 12:38 PM   Modules accepted: Orders

## 2024-01-14 NOTE — Telephone Encounter (Signed)
Pharmacy Patient Advocate Encounter  Received notification from [facility] that Prior Authorization for Zepbound 10MG /0.5ML pen-injectors has been APPROVED from 01/14/24 to 09/12/24. Ran test claim, Copay is $160.94. This test claim was processed through [facility]- copay amounts may vary at other pharmacies due to pharmacy/plan contracts, or as the patient moves through the different stages of their insurance plan.   PA #/Case ID/Reference #: [id number]  *left voicemail with [facility]

## 2024-01-18 ENCOUNTER — Other Ambulatory Visit (HOSPITAL_COMMUNITY): Payer: Self-pay

## 2024-01-19 ENCOUNTER — Other Ambulatory Visit: Payer: Self-pay | Admitting: Family Medicine

## 2024-01-29 ENCOUNTER — Ambulatory Visit: Payer: No Typology Code available for payment source | Admitting: Neurology

## 2024-02-02 ENCOUNTER — Encounter: Payer: Self-pay | Admitting: Family Medicine

## 2024-02-03 ENCOUNTER — Encounter: Payer: Self-pay | Admitting: Family Medicine

## 2024-02-03 ENCOUNTER — Telehealth (INDEPENDENT_AMBULATORY_CARE_PROVIDER_SITE_OTHER): Payer: No Typology Code available for payment source | Admitting: Family Medicine

## 2024-02-03 DIAGNOSIS — E669 Obesity, unspecified: Secondary | ICD-10-CM

## 2024-02-03 DIAGNOSIS — Z6837 Body mass index (BMI) 37.0-37.9, adult: Secondary | ICD-10-CM | POA: Diagnosis not present

## 2024-02-03 MED ORDER — ZEPBOUND 12.5 MG/0.5ML ~~LOC~~ SOAJ: 12.5000 mg | SUBCUTANEOUS | 0 refills | Status: DC

## 2024-02-03 NOTE — Progress Notes (Signed)
Virtual Visit via Video   I connected with patient on 02/03/24 at  1:40 PM EST by a video enabled telemedicine application and verified that I am speaking with the correct person using two identifiers.  Location patient: Home Location provider: [place], Office Persons participating in the virtual visit: Patient, Provider, CMA Thea Silversmith H)  I discussed the limitations of evaluation and management by telemedicine and the availability of in person appointments. The patient expressed understanding and agreed to proceed.  Subjective:   HPI:   Obesity- ongoing issue.  Switched from [place] to Zepbound due to the fact she was not losing weight like she thought she should be.  Currently on Zepbound 10mg  weekly.  After first injection she she developed fatigue, body aches but has not had similar sxs since.  Has not seen any change in weight- hanging out at 220 for the last month.  Did get as low as 217.  Has joined [employer], husband and family are supportive.    ROS:   See pertinent positives and negatives per HPI.  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) 09/03/2016   Hx of bariatric surgery 09/03/2016   B12 deficiency 09/03/2016   Anemia, iron deficiency 09/03/2016    Social History   Tobacco Use   Smoking status: Former    Current packs/day: 0.00    Average packs/day: 1 pack/day for 14.0 years (14.0 ttl pk-yrs)    Types: Cigarettes    Start date: 12/03/2005    Quit date: 12/04/2019    Years since quitting: 4.1   Smokeless tobacco: Never  Substance Use Topics   Alcohol use: Yes    Comment: 1-2 daily    Current  Outpatient Medications:    Biotin w/ Vitamins C & E (HAIR/SKIN/NAILS PO), Take by mouth., Disp: , Rfl:    botulinum toxin Type A (BOTOX) 200 units injection, Inject 155 units IM into Multiple sites of the face, neck,head every 90 days, Disp: 1 each, Rfl: 4   doxycycline (VIBRAMYCIN) 100 MG capsule, Take 1 capsule (100 mg total) by mouth 2 (two) times daily., Disp: 20 capsule, Rfl: 0   famotidine (PEPCID) 40 MG tablet, Take 1 tablet (40 mg total) by mouth daily., Disp: 30 tablet, Rfl: 3   levonorgestrel (MIRENA) 20 MCG/DAY IUD, 1 each by Intrauterine route once., Disp: , Rfl:    Lumateperone Tosylate (CAPLYTA) 21 MG CAPS, Take 1 capsule by mouth at bedtime., Disp: 30 capsule, Rfl: 3   Multiple Vitamin (MULTIVITAMIN) tablet, Take 1 tablet by mouth daily., Disp: , Rfl:    ondansetron (ZOFRAN) 4 MG tablet, Take 1 tablet (4 mg total) by mouth every 8 (eight) hours as needed for nausea or vomiting., Disp: 30 tablet, Rfl: 0   pantoprazole (PROTONIX) 40 MG tablet, Take 40 mg by mouth daily., Disp: , Rfl:    QUEtiapine (SEROQUEL) 50 MG tablet, TAKE 3 TABLETS BY MOUTH DAILY, Disp: 264 tablet, Rfl: 1   tacrolimus (PROTOPIC) 0.1 % ointment, Apply as directed, Disp: , Rfl:    tirzepatide (ZEPBOUND) 10 MG/0.5ML Pen, Inject 10 mg into the skin once a week., Disp: 2 mL, Rfl: 1   triamcinolone cream (  KENALOG) 0.1 %, Apply as directed, Disp: , Rfl:    Ubrogepant (UBRELVY) 100 MG TABS, Take 1 tablet (100 mg total) by mouth as needed (May repeat after 2 hours.  Maximum 2 tablets in 24 hours.)., Disp: 16 tablet, Rfl: 11   zolpidem (AMBIEN CR) 12.5 MG CR tablet, Take 1 tablet (12.5 mg total) by mouth at bedtime as needed. for sleep, Disp: 30 tablet, Rfl: 3  No Known Allergies  Objective:   There were no vitals taken for this visit. AAOx3, NAD NCAT, EOMI No obvious CN deficits Coloring WNL Pt is able to speak clearly, coherently without shortness of breath or increased work of breathing.  Thought process is  linear.  Mood is appropriate.   Assessment and Plan:   Obesity- pt reports she is tolerating the Zepbound since switching but hasn't seen much weight loss benefit.  Will increase dose to 12.5mg  weekly.  She reports she is joining the gym w/ her husband and kids so the family can hold each other accountable.  Applauded her efforts.  Will follow.   Neena Rhymes, MD 02/03/2024

## 2024-02-05 ENCOUNTER — Other Ambulatory Visit (HOSPITAL_COMMUNITY): Payer: Self-pay

## 2024-02-05 ENCOUNTER — Telehealth: Payer: Self-pay

## 2024-02-05 NOTE — Telephone Encounter (Signed)
Continuation of therapy please re assess

## 2024-02-05 NOTE — Telephone Encounter (Signed)
Is there anything else to try or should I notify patient of denial

## 2024-02-05 NOTE — Telephone Encounter (Signed)
Strange that they are denying this one when all we're doing is increasing the dose from 10 --> 12.5mg  weekly.  Is there any way it would be covered for continuation of therapy?

## 2024-02-05 NOTE — Telephone Encounter (Signed)
Pharmacy Patient Advocate Encounter  Received notification from [facility] that Prior Authorization for Zepbound 12.5MG /0.5ML pen-injectors has been DENIED.  Patient must enroll in [employer] for coverage of this medication. Patient may call [phone].   PA #/Case ID/Reference #: [id number]

## 2024-02-08 ENCOUNTER — Other Ambulatory Visit (HOSPITAL_COMMUNITY): Payer: Self-pay

## 2024-02-08 ENCOUNTER — Telehealth: Payer: Self-pay

## 2024-02-08 NOTE — Telephone Encounter (Signed)
 Pharmacy Patient Advocate Encounter   Received notification from Pt Calls Messages that prior authorization for Zepbound 12.5MG /0.5ML pen-injectors is required/requested.   Insurance verification completed.   The patient is insured through [facility] .   Per test claim: The current 28 day co-pay is, $160.94.  No PA needed at this time. This test claim was processed through [facility]- copay amounts may vary at other pharmacies due to pharmacy/plan contracts, or as the patient moves through the different stages of their insurance plan.     Per test claim: PA expires 09/13/24, approval letter is in media

## 2024-02-08 NOTE — Telephone Encounter (Signed)
 PA request has been Started. New Encounter created for follow up. For additional info see Pharmacy Prior Auth telephone encounter from 02/08/24.

## 2024-02-22 MED ORDER — BOTOX 200 UNITS IJ SOLR: INTRAMUSCULAR | 4 refills | Status: DC

## 2024-02-22 NOTE — Addendum Note (Signed)
 Addended by: Leida Lauth on: 02/22/2024 09:47 AM   Modules accepted: Orders

## 2024-02-29 ENCOUNTER — Telehealth: Payer: Self-pay | Admitting: Neurology

## 2024-02-29 ENCOUNTER — Other Ambulatory Visit (HOSPITAL_COMMUNITY): Payer: Self-pay

## 2024-02-29 NOTE — Telephone Encounter (Signed)
 PA needed for injection code 14782

## 2024-02-29 NOTE — Telephone Encounter (Signed)
 I called the insurance to make sure about the procedure code. It does NOT require a precert but does require a $35 copay.

## 2024-02-29 NOTE — Telephone Encounter (Signed)
 Pt said she needs to speak with sheena about her botox. Her husband told her that the last appt she had for botox was denied. She has an appt 03/04/24

## 2024-03-01 NOTE — Telephone Encounter (Signed)
 Patient advised.

## 2024-03-04 ENCOUNTER — Ambulatory Visit: Payer: No Typology Code available for payment source | Admitting: Neurology

## 2024-03-08 ENCOUNTER — Telehealth: Payer: Self-pay | Admitting: Pharmacy Technician

## 2024-03-08 NOTE — Telephone Encounter (Signed)
   I do not see any denials on our end for BOTOX. Can patient upload or provide a copy of denial letter for Botox.

## 2024-03-09 ENCOUNTER — Telehealth: Payer: Self-pay

## 2024-03-09 ENCOUNTER — Other Ambulatory Visit: Payer: Self-pay | Admitting: Family Medicine

## 2024-03-09 NOTE — Telephone Encounter (Signed)
 Telephone call to patient, no answer. Patient has a $50 copay for office visit and a $35 copay for the injection fee due at the time of her visit.   The procedure code was denied per patient last year.  This year the PA team check the code and patient was advised Via mychart, see note.

## 2024-03-11 NOTE — Telephone Encounter (Signed)
 We are further looking in to this with [facility].

## 2024-03-18 ENCOUNTER — Other Ambulatory Visit: Payer: Self-pay | Admitting: Family Medicine

## 2024-03-18 DIAGNOSIS — G4700 Insomnia, unspecified: Secondary | ICD-10-CM

## 2024-03-21 NOTE — Telephone Encounter (Signed)
 Requested Prescriptions   Pending Prescriptions Disp Refills   zolpidem (AMBIEN CR) 12.5 MG CR tablet [Pharmacy Med Name: ZOLPIDEM TART ER 12.5 MG TAB] 30 tablet 3    Sig: Take 1 tablet (12.5 mg total) by mouth at bedtime as needed. for sleep     Date of patient request: 03/21/2024 Last office visit: 11/18/2023 Upcoming visit: Visit date not found Date of last refill: 11/19/2023 Last refill amount: 30

## 2024-03-25 ENCOUNTER — Ambulatory Visit: Admitting: Neurology

## 2024-03-26 ENCOUNTER — Other Ambulatory Visit: Payer: Self-pay | Admitting: Family Medicine

## 2024-04-04 ENCOUNTER — Encounter: Payer: Self-pay | Admitting: Family Medicine

## 2024-04-04 ENCOUNTER — Telehealth: Payer: Self-pay

## 2024-04-04 MED ORDER — ZEPBOUND 15 MG/0.5ML ~~LOC~~ SOAJ: 15.0000 mg | SUBCUTANEOUS | 1 refills | Status: DC

## 2024-04-04 NOTE — Telephone Encounter (Signed)
 Prescription sent to pharmacy for higher dose of Zepbound 

## 2024-04-04 NOTE — Telephone Encounter (Signed)
 Noted.

## 2024-04-04 NOTE — Addendum Note (Signed)
 Addended by: Evaleigh Mccamy E on: 04/04/2024 04:07 PM   Modules accepted: Orders

## 2024-04-04 NOTE — Telephone Encounter (Signed)
 Copied from CRM [id number]. Topic: General - Other >> Apr 04, 2024  1:59 PM Monica Jefferson wrote: Reason for CRM: Patient calling regarding message from [place]. Patient stated that she does not see the reason why she has to come in when in the past she didn't have to come and is just looking for dosage increase.

## 2024-04-22 ENCOUNTER — Ambulatory Visit: Admitting: Neurology

## 2024-04-22 DIAGNOSIS — G43709 Chronic migraine without aura, not intractable, without status migrainosus: Secondary | ICD-10-CM

## 2024-04-22 MED ORDER — ONABOTULINUMTOXINA 100 UNITS IJ SOLR
200.0000 [IU] | Freq: Once | INTRAMUSCULAR | Status: AC
  Administered 2024-04-22: 155 [IU] via INTRAMUSCULAR

## 2024-04-22 NOTE — Progress Notes (Signed)

## 2024-06-03 ENCOUNTER — Ambulatory Visit: Admitting: Neurology

## 2024-06-09 ENCOUNTER — Encounter: Payer: Self-pay | Admitting: Family Medicine

## 2024-06-10 NOTE — Telephone Encounter (Signed)
 Patient notes her coverage of Zepbound  runs out on the 1st and she was unsure if pursuing a formulary exception or trying an alternative would be better.

## 2024-06-20 ENCOUNTER — Telehealth: Payer: Self-pay

## 2024-06-20 ENCOUNTER — Other Ambulatory Visit (HOSPITAL_COMMUNITY): Payer: Self-pay

## 2024-06-20 NOTE — Telephone Encounter (Signed)
 Pharmacy Patient Advocate Encounter   Received notification from Pt Calls Messages that prior authorization for Zepbound  15MG /0.5ML is required/requested.   Insurance verification completed.   The patient is insured through [facility] .   Per test claim:  Wegovy  or Mounjaro   is preferred by the insurance.  If suggested medication is appropriate, Please send in a new RX and discontinue this one. If not, please advise as to why it's not appropriate so that we may request a Prior Authorization. Please note, some preferred medications may still require a PA.  If the suggested medications have not been trialed and there are no contraindications to their use, the PA will not be submitted, as it will not be approved.   [facility] requires chart notes documenting the patient's inadequate treatment response, intolerance or contraindication to Wegovy .

## 2024-06-20 NOTE — Telephone Encounter (Signed)
 As noted previously, she was on Wegovy  and we needed to switch to Zepbound  b/c she was having issues tolerating the Wegovy  and she was not losing weight even at the max dose.  She is doing better on the Zepbound  she is currently taking

## 2024-06-20 NOTE — Telephone Encounter (Signed)
 Patient wanted to let you know she is open to trying ozempic if that is an option. Patient also states she is not getting the same price break anymore and is not sure if compounded versions will be covered?

## 2024-06-20 NOTE — Telephone Encounter (Signed)
 States wegovy  or mounjaro  is preferred by insurance. [facility] requires chart notes documenting the patients inadequate treatment response,intolerance or contraindication to wegovy .

## 2024-06-21 ENCOUNTER — Other Ambulatory Visit (HOSPITAL_COMMUNITY): Payer: Self-pay

## 2024-06-21 NOTE — Telephone Encounter (Signed)
 I understand. However, per insurance requirement chart notes (documenting contraindication) to be submitted. The last office visit was in 2024.

## 2024-06-21 NOTE — Telephone Encounter (Signed)
 Called patient to relay Dr.Tabori's note. Left Vm for patient to return call

## 2024-06-21 NOTE — Telephone Encounter (Signed)
 Please let pt know that per insurance requirements, we will need to have an office visit documenting why she needs to take Zepbound .  If she is willing to switch back to Wegovy , we can do that and see how things go.  I will leave this up to her.

## 2024-06-22 ENCOUNTER — Ambulatory Visit (INDEPENDENT_AMBULATORY_CARE_PROVIDER_SITE_OTHER): Admitting: Family Medicine

## 2024-06-22 ENCOUNTER — Encounter: Payer: Self-pay | Admitting: Family Medicine

## 2024-06-22 VITALS — BP 112/80 | HR 71 | Temp 98.1°F | Ht 64.0 in | Wt 198.4 lb

## 2024-06-22 DIAGNOSIS — E669 Obesity, unspecified: Secondary | ICD-10-CM

## 2024-06-22 MED ORDER — ZEPBOUND 15 MG/0.5ML ~~LOC~~ SOAJ: 15.0000 mg | SUBCUTANEOUS | 1 refills | Status: DC

## 2024-06-22 NOTE — Progress Notes (Signed)
   Subjective:    Patient ID: Monica Jefferson, female    DOB: 24-Mar-1981, 43 y.o.   MRN: [id number]  HPI Obesity- pt is down 26 lbs since her last visit and switching to Zepbound .  She was 224- so this is over 10% of her body weight.  Pt is exercising regularly.  Pt reports feeling better both physically and emotionally.  Is tolerating Zepbound  w/o difficulty.  She is not having the nausea or indigestion she previously had on Wegovy    Review of Systems For ROS see HPI     Objective:   Physical Exam Vitals reviewed.  Constitutional:      General: She is not in acute distress.    Appearance: Normal appearance. She is well-developed. She is not ill-appearing.  HENT:     Head: Normocephalic and atraumatic.  Eyes:     Conjunctiva/sclera: Conjunctivae normal.     Pupils: Pupils are equal, round, and reactive to light.  Neck:     Thyroid : No thyromegaly.  Cardiovascular:     Rate and Rhythm: Normal rate and regular rhythm.     Heart sounds: Normal heart sounds. No murmur heard. Pulmonary:     Effort: Pulmonary effort is normal. No respiratory distress.     Breath sounds: Normal breath sounds.  Abdominal:     General: There is no distension.     Palpations: Abdomen is soft.     Tenderness: There is no abdominal tenderness.  Musculoskeletal:     Cervical back: Normal range of motion and neck supple.     Right lower leg: No edema.     Left lower leg: No edema.  Lymphadenopathy:     Cervical: No cervical adenopathy.  Skin:    General: Skin is warm and dry.  Neurological:     General: No focal deficit present.     Mental Status: She is alert and oriented to person, place, and time.  Psychiatric:        Mood and Affect: Mood normal.        Behavior: Behavior normal.        Thought Content: Thought content normal.           Assessment & Plan:

## 2024-06-22 NOTE — Patient Instructions (Addendum)
 Schedule your complete physical in 6 months We'll notify you of your lab results and make any changes if needed Keep up the good work on healthy diet and regular exercise- you look FANTASTIC Call with any questions or concerns Stay Safe!  Stay Healthy! Enjoy the rest of your summer!!!

## 2024-06-22 NOTE — Assessment & Plan Note (Signed)
 Pt is down another 26 lbs after switching to Zepbound  when she plateau'ed on Wegovy .  That was more than 10% of her body weight at the time.  She is exercising regularly.  She is feeling better physically and emotionally with the ongoing weight loss.  Will check labs as it has been 2 yrs.  Applauded her efforts.

## 2024-06-22 NOTE — Telephone Encounter (Signed)
 Pt has appt today

## 2024-06-23 ENCOUNTER — Other Ambulatory Visit (HOSPITAL_COMMUNITY): Payer: Self-pay

## 2024-06-23 ENCOUNTER — Other Ambulatory Visit: Payer: Self-pay | Admitting: Family

## 2024-06-23 LAB — CBC WITH DIFFERENTIAL/PLATELET
Basophils Absolute: 0 K/uL (ref 0.0–0.1)
Basophils Relative: 0.3 % (ref 0.0–3.0)
Eosinophils Absolute: 0.1 K/uL (ref 0.0–0.7)
Eosinophils Relative: 3.7 % (ref 0.0–5.0)
HCT: 35.8 % — ABNORMAL LOW (ref 36.0–46.0)
Hemoglobin: 11.5 g/dL — ABNORMAL LOW (ref 12.0–15.0)
Lymphocytes Relative: 30.9 % (ref 12.0–46.0)
Lymphs Abs: 1.1 K/uL (ref 0.7–4.0)
MCHC: 32.2 g/dL (ref 30.0–36.0)
MCV: 97.6 fl (ref 78.0–100.0)
Monocytes Absolute: 0.3 K/uL (ref 0.1–1.0)
Monocytes Relative: 9.1 % (ref 3.0–12.0)
Neutro Abs: 2 K/uL (ref 1.4–7.7)
Neutrophils Relative %: 56 % (ref 43.0–77.0)
Platelets: 224 K/uL (ref 150.0–400.0)
RBC: 3.67 Mil/uL — ABNORMAL LOW (ref 3.87–5.11)
RDW: 16.3 % — ABNORMAL HIGH (ref 11.5–15.5)
WBC: 3.6 K/uL — ABNORMAL LOW (ref 4.0–10.5)

## 2024-06-23 LAB — LIPID PANEL
Cholesterol: 181 mg/dL (ref 0–200)
HDL: 87 mg/dL (ref >39.00)
LDL Cholesterol: 84 mg/dL (ref 0–99)
NonHDL: 93.98
Total CHOL/HDL Ratio: 2
Triglycerides: 52 mg/dL (ref 0.0–149.0)
VLDL: 10.4 mg/dL (ref 0.0–40.0)

## 2024-06-23 LAB — HEPATIC FUNCTION PANEL
ALT: 62 U/L — ABNORMAL HIGH (ref 0–35)
AST: 78 U/L — ABNORMAL HIGH (ref 0–37)
Albumin: 4.1 g/dL (ref 3.5–5.2)
Alkaline Phosphatase: 72 U/L (ref 39–117)
Bilirubin, Direct: 0.1 mg/dL (ref 0.0–0.3)
Total Bilirubin: 0.6 mg/dL (ref 0.2–1.2)
Total Protein: 7.1 g/dL (ref 6.0–8.3)

## 2024-06-23 LAB — BASIC METABOLIC PANEL WITH GFR
BUN: 8 mg/dL (ref 6–23)
CO2: 28 meq/L (ref 19–32)
Calcium: 9.2 mg/dL (ref 8.4–10.5)
Chloride: 104 meq/L (ref 96–112)
Creatinine, Ser: 0.7 mg/dL (ref 0.40–1.20)
GFR: 104.99 mL/min (ref >60.00)
Glucose, Bld: 89 mg/dL (ref 70–99)
Potassium: 4.9 meq/L (ref 3.5–5.1)
Sodium: 137 meq/L (ref 135–145)

## 2024-06-23 LAB — TSH: TSH: 2.53 u[IU]/mL (ref 0.35–5.50)

## 2024-06-23 LAB — HEMOGLOBIN A1C: Hgb A1c MFr Bld: 5.1 % (ref 4.6–6.5)

## 2024-06-23 MED ORDER — TIRZEPATIDE 2.5 MG/0.5ML ~~LOC~~ SOAJ: 2.5000 mg | SUBCUTANEOUS | 1 refills | Status: DC

## 2024-06-23 NOTE — Telephone Encounter (Signed)
 I spoke to the insurance rep and they advise patient could get second alternative Mounjaro  (tirzepatide ) same active ingredient as Zepbound . If appropiate send new rx.

## 2024-06-23 NOTE — Telephone Encounter (Signed)
 I dont see an active Mounjaro  RX however I do see the approval can you send in appropriate dose for patient so she can continue?

## 2024-06-23 NOTE — Telephone Encounter (Signed)
 Per pharmacy team due to coverage patient needs alternative medication sent if that is appropriate, please advise

## 2024-06-24 ENCOUNTER — Telehealth: Payer: Self-pay

## 2024-06-24 ENCOUNTER — Other Ambulatory Visit (HOSPITAL_COMMUNITY): Payer: Self-pay

## 2024-06-24 ENCOUNTER — Other Ambulatory Visit: Payer: Self-pay | Admitting: Family

## 2024-06-24 DIAGNOSIS — E669 Obesity, unspecified: Secondary | ICD-10-CM

## 2024-06-24 NOTE — Telephone Encounter (Signed)
 Pharmacy Patient Advocate Encounter   Received notification from Pt Calls Messages that prior authorization for MOUNJARO  is required/requested.   Insurance verification completed.   The patient is insured through [facility] .   Per test claim: PA required and submitted KEY/EOC/Request #: [id number] from 06/23/24 to 02/21/25   PA- [id number] SS

## 2024-06-24 NOTE — Telephone Encounter (Signed)
 Patient has been informed sending message thread to Dr Mahlon as an FYI for context to this question when she returns

## 2024-06-24 NOTE — Telephone Encounter (Signed)
 Copied from CRM [id number]. Topic: Clinical - Medication Question >> Jun 24, 2024 10:41 AM Chasity T wrote: Reason for CRM: Patient calling in for questions regarding medication tirzepatide  (MOUNJARO ) 2.5 MG/0.5ML Pen. Please contact her back for discussion.

## 2024-06-24 NOTE — Telephone Encounter (Signed)
 Called and spoke with the patient and she just wanted to ensure the Mounjaro  2.5 was the correct dose as she was on 15 mg of Zepbound  let her know these are not equal dosing  Did get a letter from Pharmacy asking for PCP to contact informed patient I will call pharmacy and find out  Called pharmacy and they stated that this error was due to the refill being too soon notes she can get it next week due to when she filled the previous medication  Called the patient back and let her know that the issue with the prescription needing to wait till next week.   Kenney Roys, FNP : Patient is concerned this dose of Mounjaro  is not correct as she states she has been taking Zepbound  15 mg and she did reading online that stated these are equivalent medications she feels she should be on 15 or higher.  Please advise.

## 2024-06-29 MED ORDER — TIRZEPATIDE 15 MG/0.5ML ~~LOC~~ SOAJ: 15.0000 mg | SUBCUTANEOUS | 2 refills | Status: AC

## 2024-06-29 NOTE — Telephone Encounter (Signed)
 I have sent a refill of tirzepatide  15 mg weekly under brand name Mounjaro  to her pharmacy.  Brand-name switch from Zepbound  to Mounjaro  should be safe, I do not think there is a need to decrease the dosage.  I called and left a voicemail for the patient with this information.

## 2024-06-29 NOTE — Addendum Note (Signed)
 Addended by: JERRELL SOLIAN T on: 06/29/2024 09:44 AM   Modules accepted: Orders

## 2024-06-29 NOTE — Telephone Encounter (Signed)
 Patient is supposed to be switching to Mounjaro  from Zepbound  15 mg dose.  Padonda advised 2.5 mg dose but patient is insistent it must be higher dose and that they should be equal doses. Could one of you please review this and see if there is more to advise or should I have patient wait for Dr Donaciano return?

## 2024-07-04 ENCOUNTER — Other Ambulatory Visit (HOSPITAL_COMMUNITY): Payer: Self-pay

## 2024-07-19 ENCOUNTER — Other Ambulatory Visit: Payer: Self-pay | Admitting: Family Medicine

## 2024-07-19 DIAGNOSIS — G4700 Insomnia, unspecified: Secondary | ICD-10-CM

## 2024-07-19 NOTE — Telephone Encounter (Signed)
 Requested Prescriptions   Pending Prescriptions Disp Refills   zolpidem  (AMBIEN  CR) 12.5 MG CR tablet [Pharmacy Med Name: ZOLPIDEM  TART ER 12.5 MG TAB] 30 tablet 3    Sig: TAKE 1 TABLET (12.5 MG TOTAL) BY MOUTH AT BEDTIME AS NEEDED. FOR SLEEP     Date of patient request: 07/19/24 Last office visit: 06/22/2024 Upcoming visit: Visit date not found Date of last refill: 03/21/24 Last refill amount: 30 days with 3 refills

## 2024-07-22 ENCOUNTER — Ambulatory Visit: Admitting: Neurology

## 2024-07-22 DIAGNOSIS — G43709 Chronic migraine without aura, not intractable, without status migrainosus: Secondary | ICD-10-CM

## 2024-07-22 MED ORDER — ONABOTULINUMTOXINA 100 UNITS IJ SOLR
200.0000 [IU] | Freq: Once | INTRAMUSCULAR | Status: AC
  Administered 2024-07-22: 155 [IU] via INTRAMUSCULAR

## 2024-07-22 NOTE — Progress Notes (Signed)

## 2024-08-24 ENCOUNTER — Other Ambulatory Visit: Payer: Self-pay | Admitting: Family Medicine

## 2024-08-25 ENCOUNTER — Telehealth: Payer: Self-pay | Admitting: Pharmacy Technician

## 2024-08-25 ENCOUNTER — Other Ambulatory Visit (HOSPITAL_COMMUNITY): Payer: Self-pay

## 2024-08-25 NOTE — Telephone Encounter (Signed)
 Pharmacy Patient Advocate Encounter  BotoxOne verification has been submitted. Benefit Verification #:  [id number]  Pharmacy PA has been submitted for BOTOX  200u via Fax. INSURANCE: [employer] DATE SUBMITTED: 9.11.25 KEY: [id number] Status is pending

## 2024-08-25 NOTE — Telephone Encounter (Signed)
 Pharmacy Patient Advocate Encounter- Injection via Pharmacy Benefit:  PA was submitted  for Botox - J0585 to [employer] and has been approved through: 9.11.25 TO 9.11.26 Authorization# [id number]  Please send prescription to Specialty Pharmacy: [facility]: [phone] Estimated Pharmacy Copay is: ? LAST FILLED 7.22.25  Patient IS NOT eligible for Botox - G9414 Copay Card, which will make patient's copay as little as zero. Copay card will be provided to pharmacy.   Admin Code: 35384   Pending BotoxOne report require Prior Auth.

## 2024-08-30 NOTE — Telephone Encounter (Signed)
 CPT CODE 35384 does not require a PA/Pre-D

## 2024-09-13 ENCOUNTER — Other Ambulatory Visit (HOSPITAL_COMMUNITY): Payer: Self-pay

## 2024-09-19 NOTE — Progress Notes (Unsigned)
 NEUROLOGY FOLLOW UP OFFICE NOTE  Monica Jefferson [id number]  Assessment/Plan:   Migraine without aura, without status migrainosus, not intractable -responding well to Botox     Migraine prevention:  Botox  Migraine rescue:  Ubrelvy  100mg  Lifestyle modification: Limit use of pain relievers to no more than 9 days out of the month to prevent risk of rebound or medication-overuse headache. Diet modification/hydration/caffeine cessation Routine exercise Sleep hygiene Consider vitamins/supplements:  magnesium citrate 400mg  daily, riboflavin 400mg  daily, CoQ10 100mg  three times daily Keep headache diary Follow up one year for routine visit.     Subjective:  Monica Jefferson is a 43 year old female with Bipolar disorder, depression/anxiety who follows up for migraine.   UPDATE:  Doing well Intensity:  moderate to severe Duration:  2 hours with Ubrelvy .  Frequency:  on average has 2 a month.     Current NSAIDS:  Advil (occasional, less than once a week) Current analgesics:  none Current triptans:  none Current ergotamine:  none Current anti-emetic:  none Current muscle relaxants:  none Current sleep aide:  Ambien , Seroquel  150mg  at bedtime Current Antihypertensive medications:  none Current Antidepressant/antipsychotic medications:  Seroquel  25mg  at bedtime Current Anticonvulsant medications:  none Current anti-CGRP:  none Current Vitamins/Herbal/Supplements:  none Current Antihistamines/Decongestants:  Scopolamine  patch; Flonase  Other therapy:  Botox  Hormone/birth control:  IUD   Caffeine:  1 to 2 cans of Coke (usually caffeine-free) daily; No coffee.  Occasional frappe Smoker: 11 cigarettes a day.  Just started Chantix  Diet:  2 Cokes a day. Exercise:  Not routine Depression:  stable; Anxiety:  yes Other pain:  Some back pain Sleep hygiene:  Poor.  She has insomnia.  She saw sleep medicine.  Sleep study did not demonstrate sleep apnea.     HISTORY:  Onset:  Childhood.    Location:  Left frontal, may become diffuse/face/periorbital, may radiate into neck Quality:  Dull ache progressing to throbbing Intensity:  severe.  She denies new headache, thunderclap headache Aura:  no Premonitory Phase:  none Postdrome:  fatigue Associated symptoms:  Nausea, photophobia, phonophobia, osmophobia.  She denies associated vomiting, visual disturbance, unilateral numbness or weakness. Duration:  May be a day but may occur off and on for 3 to 5 days Frequency:  daily Frequency of abortive medication: 2 to 3 days a week Triggers:  Emotional stress, seasonal allergies, perfumes/scented candles Relieving factors:  Closing eyes, rest Activity:  Daily activity aggravates She does have an underlying dull daily headache.   Has seen neurology in the past.  She has had brain MRI.   Past NSAIDS:  none Past analgesics:  Tylenol  Past abortive triptans:  Sumatriptan  50mg ; Maxalt  10mg , Tosymra  (caused nausea) Past abortive ergotamine:  none Past muscle relaxants:  none Past anti-emetic:  Zofran  Past antihypertensive medications:  propranolol Past antidepressant medications:  sertraline Past anticonvulsant medications:  topiramate Past anti-CGRP:  Aimovig  70mg  (constipation), Nurtec (rescue, reduced intensity but not abort, made her anxious), Reyvow (made her anxious Past vitamins/Herbal/Supplements:  none Past antihistamines/decongestants:  meclizine , scopolamine  patch Other past therapies:  Reyvow  PAST MEDICAL HISTORY: Past Medical History:  Diagnosis Date   Allergic rhinitis    Anal fistula    Anxiety    B12 deficiency    Bipolar disorder in partial remission    FOLLOWED BY PCP   Depression    Eczema    Frequent headaches    Hemorrhoids    History of hypothyroidism    per pt hx yrs ago   Hypertension  Iron deficiency anemia    Obesity    OSA (obstructive sleep apnea)    per pt dx  prior to gastric bypass in 2008 wore cpap for awhile but has not used in yrs    PONV (postoperative nausea and vomiting)    severe    MEDICATIONS: Current Outpatient Medications on File Prior to Visit  Medication Sig Dispense Refill   Biotin w/ Vitamins C & E (HAIR/SKIN/NAILS PO) Take by mouth.     botulinum toxin Type A  (BOTOX ) 200 units injection Inject 155 units IM into Multiple sites of the face, neck,head every 90 days 1 each 4   famotidine  (PEPCID ) 40 MG tablet TAKE 1 TABLET BY MOUTH EVERY DAY 90 tablet 1   levonorgestrel (MIRENA) 20 MCG/DAY IUD 1 each by Intrauterine route once.     Multiple Vitamin (MULTIVITAMIN) tablet Take 1 tablet by mouth daily.     ondansetron  (ZOFRAN ) 4 MG tablet Take 1 tablet (4 mg total) by mouth every 8 (eight) hours as needed for nausea or vomiting. 30 tablet 0   pantoprazole  (PROTONIX ) 40 MG tablet Take 40 mg by mouth daily.     QUEtiapine  (SEROQUEL ) 50 MG tablet TAKE 3 TABLETS BY MOUTH DAILY 264 tablet 1   tacrolimus (PROTOPIC) 0.1 % ointment Apply as directed     tirzepatide  (MOUNJARO ) 15 MG/0.5ML Pen Inject 15 mg into the skin once a week. 6 mL 2   triamcinolone cream (KENALOG) 0.1 % Apply as directed     Ubrogepant  (UBRELVY ) 100 MG TABS Take 1 tablet (100 mg total) by mouth as needed (May repeat after 2 hours.  Maximum 2 tablets in 24 hours.). 16 tablet 11   zolpidem  (AMBIEN  CR) 12.5 MG CR tablet TAKE 1 TABLET (12.5 MG TOTAL) BY MOUTH AT BEDTIME AS NEEDED. FOR SLEEP 30 tablet 3   No current facility-administered medications on file prior to visit.    ALLERGIES: No Known Allergies  FAMILY HISTORY: Family History  Problem Relation Age of Onset   Mental illness Mother    Diverticulitis Mother    Alcohol abuse Father    Mental illness Father    Mental illness Maternal Aunt    Mental illness Maternal Uncle    Alcohol abuse Maternal Uncle    Mental illness Maternal Grandmother    Heart disease Maternal Grandmother    Diabetes Maternal Grandmother    Colon polyps Neg Hx    Esophageal cancer Neg Hx    Stomach cancer  Neg Hx    Rectal cancer Neg Hx       Objective:  Blood pressure 136/88, pulse 89, height 5' 4.5 (1.638 m), weight 187 lb (84.8 kg), SpO2 99%. General: No acute distress.  Patient appears well-groomed.   Head:  Normocephalic/atraumatic Eyes:  Fundi examined but not visualized Neck: supple, no paraspinal tenderness, full range of motion Heart:  Regular rate and rhythm Neurological Exam: alert and oriented.  Speech fluent and not dysarthric, language intact.  CN II-XII intact. Bulk and tone normal, muscle strength 5/5 throughout.  Sensation to light touch intact.  Deep tendon reflexes 2+ throughout, toes downgoing.  Finger to nose testing intact.  Gait normal, Romberg negative.   Juliene Dunnings, DO  CC: Comer Greet, MD

## 2024-09-20 ENCOUNTER — Ambulatory Visit (INDEPENDENT_AMBULATORY_CARE_PROVIDER_SITE_OTHER): Payer: No Typology Code available for payment source | Admitting: Neurology

## 2024-09-20 ENCOUNTER — Encounter: Payer: Self-pay | Admitting: Neurology

## 2024-09-20 VITALS — BP 136/88 | HR 89 | Ht 64.5 in | Wt 187.0 lb

## 2024-09-20 DIAGNOSIS — G43009 Migraine without aura, not intractable, without status migrainosus: Secondary | ICD-10-CM

## 2024-09-20 MED ORDER — UBRELVY 100 MG PO TABS: 1.0000 | ORAL_TABLET | ORAL | 11 refills | Status: DC | PRN

## 2024-09-20 NOTE — Patient Instructions (Signed)
 Migraine prevention:  Botox  every 3 months Migraine rescue:  Ubrelvy  as needed Lifestyle modification: Limit use of pain relievers to no more than 9 days out of the month to prevent risk of rebound or medication-overuse headache. Diet modification/hydration/caffeine cessation Routine exercise Sleep hygiene Consider vitamins/supplements:  magnesium citrate 400mg  daily, riboflavin 400mg  daily, CoQ10 100mg  three times daily Keep headache diary Follow up one year

## 2024-09-23 ENCOUNTER — Other Ambulatory Visit: Payer: Self-pay | Admitting: Family Medicine

## 2024-10-21 ENCOUNTER — Ambulatory Visit: Admitting: Neurology

## 2024-10-28 ENCOUNTER — Ambulatory Visit (INDEPENDENT_AMBULATORY_CARE_PROVIDER_SITE_OTHER): Admitting: Neurology

## 2024-10-28 DIAGNOSIS — G43709 Chronic migraine without aura, not intractable, without status migrainosus: Secondary | ICD-10-CM

## 2024-10-28 MED ORDER — ONABOTULINUMTOXINA 100 UNITS IJ SOLR
200.0000 [IU] | Freq: Once | INTRAMUSCULAR | Status: AC
  Administered 2024-10-28: 155 [IU] via INTRAMUSCULAR

## 2024-10-31 NOTE — Progress Notes (Signed)

## 2024-11-16 ENCOUNTER — Encounter: Payer: Self-pay | Admitting: Family Medicine

## 2024-11-16 ENCOUNTER — Other Ambulatory Visit: Payer: Self-pay | Admitting: Family Medicine

## 2024-11-16 DIAGNOSIS — G4700 Insomnia, unspecified: Secondary | ICD-10-CM

## 2024-11-16 NOTE — Telephone Encounter (Signed)
 Requested Prescriptions   Pending Prescriptions Disp Refills   zolpidem  (AMBIEN  CR) 12.5 MG CR tablet [Pharmacy Med Name: ZOLPIDEM  TART ER 12.5 MG TAB] 30 tablet 3    Sig: TAKE 1 TABLET BY MOUTH AT BEDTIME AS NEEDED SLEEP     Date of patient request: 11/16/24 Last office visit: 06/22/2024 Upcoming visit: Visit date not found Date of last refill: 07/19/24 Last refill amount: 30 w/ 3 refills

## 2024-11-17 MED ORDER — ZOLPIDEM TARTRATE ER 12.5 MG PO TBCR: 12.5000 mg | EXTENDED_RELEASE_TABLET | Freq: Every day | ORAL | 1 refills | Status: AC

## 2024-11-17 NOTE — Telephone Encounter (Signed)
 Patient would like a 90D supply of Zolpidem . Okay to change to 90D and send in refill?

## 2024-11-29 ENCOUNTER — Other Ambulatory Visit: Payer: Self-pay | Admitting: Neurology

## 2024-12-02 ENCOUNTER — Telehealth: Payer: Self-pay | Admitting: Neurology

## 2024-12-02 NOTE — Telephone Encounter (Signed)
 Monica Jefferson wants office to know [facility] [phone] will be handling Pt's botox 

## 2024-12-27 ENCOUNTER — Telehealth: Payer: Self-pay | Admitting: Neurology

## 2024-12-27 ENCOUNTER — Other Ambulatory Visit (HOSPITAL_COMMUNITY): Payer: Self-pay

## 2024-12-27 NOTE — Telephone Encounter (Signed)
 [employer] insurance Policy# [id number] Grp# [id number]  PCN: [id number] BIN: [id number]  Insurer# [id number]

## 2024-12-27 NOTE — Telephone Encounter (Signed)
 [facility] Call ID: [id number]  Caller states she is calling about pt's botox  medication. She would like to see if it can be sent to [facility].    Call back #: [phone]  [facility] #: [phone]  Fax: [phone]

## 2024-12-27 NOTE — Telephone Encounter (Signed)
 Would need to verify with pt if she had a change in her insurance. I'm not showing active insurance for the patient at this time. Patient may be locked in with a different pharmacy but unable to verify that at this time. Would need to verify insurance with patient.

## 2024-12-28 ENCOUNTER — Other Ambulatory Visit (HOSPITAL_COMMUNITY): Payer: Self-pay

## 2024-12-28 NOTE — Telephone Encounter (Signed)
 Can the patient scan in the card? I'm still getting a rejection when I do a test bill with the information provided. This will also let me know if a new PA will need to be completed which would be likely as well.

## 2024-12-29 ENCOUNTER — Other Ambulatory Visit (HOSPITAL_COMMUNITY): Payer: Self-pay

## 2024-12-29 NOTE — Telephone Encounter (Signed)
 Mychart message sent to patient to have cards attach to it.

## 2024-12-29 NOTE — Telephone Encounter (Signed)
 Yes [facility] [phone]) is where the Botox  will have to be filled. Current PA should still be valid until renewal is needed.

## 2024-12-30 MED ORDER — BOTOX 200 UNITS IJ SOLR: INTRAMUSCULAR | 4 refills | Status: AC

## 2024-12-30 NOTE — Telephone Encounter (Signed)
 Spoke to McPherson the pharmacist at [facility] order given for Botox  200 units every 90 days, Inject 155 units IM into multiple site in the face,neck and head once every 90 days.

## 2025-01-19 ENCOUNTER — Telehealth: Payer: Self-pay

## 2025-01-19 NOTE — Telephone Encounter (Signed)
 Monica Jefferson, Monica Jefferson 22-Dec-1980  ID: [id number] BOTOX  200 UNIT SDV PWD Directions: INJECT 155 UNITS INTO THE MUSCLES, INTO MULTIPLE SITES OF THE FACE, HEAD, AND NECK EVERY 90 DAYS (DISCARD UNUSED PORTION MUST RECONSTITUTE) Delivery Date: Thursday, 01/26/2025 Shipping Address: [address], [place], [place], [address]

## 2025-01-27 ENCOUNTER — Ambulatory Visit: Admitting: Neurology

## 2025-09-20 ENCOUNTER — Ambulatory Visit: Admitting: Neurology
